# Patient Record
Sex: Female | Born: 1980 | Race: Black or African American | Hispanic: No | Marital: Single | Smoking: Never smoker
Health system: Southern US, Community
[De-identification: ages and names within clinical notes are randomized; demographics above are authoritative.]

## PROBLEM LIST (undated history)

## (undated) DIAGNOSIS — K219 Gastro-esophageal reflux disease without esophagitis: Secondary | ICD-10-CM

## (undated) DIAGNOSIS — R87629 Unspecified abnormal cytological findings in specimens from vagina: Secondary | ICD-10-CM

## (undated) DIAGNOSIS — A749 Chlamydial infection, unspecified: Secondary | ICD-10-CM

## (undated) DIAGNOSIS — B977 Papillomavirus as the cause of diseases classified elsewhere: Secondary | ICD-10-CM

## (undated) DIAGNOSIS — E049 Nontoxic goiter, unspecified: Secondary | ICD-10-CM

## (undated) DIAGNOSIS — O26899 Other specified pregnancy related conditions, unspecified trimester: Secondary | ICD-10-CM

## (undated) DIAGNOSIS — C52 Malignant neoplasm of vagina: Secondary | ICD-10-CM

## (undated) DIAGNOSIS — F419 Anxiety disorder, unspecified: Secondary | ICD-10-CM

## (undated) DIAGNOSIS — D649 Anemia, unspecified: Secondary | ICD-10-CM

## (undated) DIAGNOSIS — F32A Depression, unspecified: Secondary | ICD-10-CM

## (undated) DIAGNOSIS — G5600 Carpal tunnel syndrome, unspecified upper limb: Secondary | ICD-10-CM

## (undated) DIAGNOSIS — A599 Trichomoniasis, unspecified: Secondary | ICD-10-CM

## (undated) DIAGNOSIS — K4090 Unilateral inguinal hernia, without obstruction or gangrene, not specified as recurrent: Secondary | ICD-10-CM

## (undated) DIAGNOSIS — E559 Vitamin D deficiency, unspecified: Secondary | ICD-10-CM

## (undated) HISTORY — DX: Papillomavirus as the cause of diseases classified elsewhere: B97.7

## (undated) HISTORY — DX: Unilateral inguinal hernia, without obstruction or gangrene, not specified as recurrent: K40.90

## (undated) HISTORY — PX: NO PAST SURGERIES: SHX2092

## (undated) HISTORY — DX: Chlamydial infection, unspecified: A74.9

## (undated) HISTORY — DX: Gastro-esophageal reflux disease without esophagitis: K21.9

## (undated) HISTORY — DX: Unspecified abnormal cytological findings in specimens from vagina: R87.629

## (undated) HISTORY — DX: Vitamin D deficiency, unspecified: E55.9

## (undated) HISTORY — DX: Anxiety disorder, unspecified: F41.9

## (undated) HISTORY — DX: Carpal tunnel syndrome, unspecified upper limb: G56.00

## (undated) HISTORY — DX: Nontoxic goiter, unspecified: E04.9

## (undated) HISTORY — DX: Carpal tunnel syndrome, unspecified upper limb: O26.899

## (undated) HISTORY — DX: Trichomoniasis, unspecified: A59.9

## (undated) HISTORY — DX: Depression, unspecified: F32.A

## (undated) HISTORY — DX: Anemia, unspecified: D64.9

---
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
## 2001-05-30 ENCOUNTER — Emergency Department (HOSPITAL_COMMUNITY): Admission: EM | Admit: 2001-05-30 | Discharge: 2001-05-31 | Payer: Self-pay | Admitting: Emergency Medicine

## 2001-06-01 ENCOUNTER — Ambulatory Visit (HOSPITAL_COMMUNITY): Admission: RE | Admit: 2001-06-01 | Discharge: 2001-06-01 | Payer: Self-pay | Admitting: Emergency Medicine

## 2001-06-01 ENCOUNTER — Encounter: Payer: Self-pay | Admitting: Emergency Medicine

## 2001-06-12 ENCOUNTER — Emergency Department (HOSPITAL_COMMUNITY): Admission: EM | Admit: 2001-06-12 | Discharge: 2001-06-12 | Payer: Self-pay | Admitting: Emergency Medicine

## 2001-06-12 ENCOUNTER — Encounter: Payer: Self-pay | Admitting: Emergency Medicine

## 2001-10-11 ENCOUNTER — Inpatient Hospital Stay (HOSPITAL_COMMUNITY): Admission: AD | Admit: 2001-10-11 | Discharge: 2001-10-11 | Payer: Self-pay | Admitting: *Deleted

## 2001-12-20 ENCOUNTER — Encounter: Admission: RE | Admit: 2001-12-20 | Discharge: 2001-12-20 | Payer: Self-pay | Admitting: *Deleted

## 2002-11-15 ENCOUNTER — Other Ambulatory Visit: Admission: RE | Admit: 2002-11-15 | Discharge: 2002-11-15 | Payer: Self-pay | Admitting: Obstetrics and Gynecology

## 2002-11-21 ENCOUNTER — Inpatient Hospital Stay (HOSPITAL_COMMUNITY): Admission: AD | Admit: 2002-11-21 | Discharge: 2002-11-21 | Payer: Self-pay | Admitting: Obstetrics & Gynecology

## 2003-01-28 ENCOUNTER — Inpatient Hospital Stay (HOSPITAL_COMMUNITY): Admission: AD | Admit: 2003-01-28 | Discharge: 2003-01-28 | Payer: Self-pay | Admitting: Obstetrics & Gynecology

## 2003-01-31 ENCOUNTER — Inpatient Hospital Stay (HOSPITAL_COMMUNITY): Admission: AD | Admit: 2003-01-31 | Discharge: 2003-02-02 | Payer: Self-pay | Admitting: Obstetrics and Gynecology

## 2003-07-24 ENCOUNTER — Emergency Department (HOSPITAL_COMMUNITY): Admission: EM | Admit: 2003-07-24 | Discharge: 2003-07-24 | Payer: Self-pay | Admitting: Emergency Medicine

## 2003-09-21 ENCOUNTER — Emergency Department (HOSPITAL_COMMUNITY): Admission: EM | Admit: 2003-09-21 | Discharge: 2003-09-21 | Payer: Self-pay | Admitting: Emergency Medicine

## 2003-10-28 ENCOUNTER — Emergency Department (HOSPITAL_COMMUNITY): Admission: EM | Admit: 2003-10-28 | Discharge: 2003-10-28 | Payer: Self-pay | Admitting: Emergency Medicine

## 2004-05-08 ENCOUNTER — Encounter: Admission: RE | Admit: 2004-05-08 | Discharge: 2004-05-08 | Payer: Self-pay | Admitting: Cardiology

## 2004-09-01 ENCOUNTER — Other Ambulatory Visit: Admission: RE | Admit: 2004-09-01 | Discharge: 2004-09-01 | Payer: Self-pay | Admitting: Obstetrics and Gynecology

## 2004-11-03 ENCOUNTER — Other Ambulatory Visit: Admission: RE | Admit: 2004-11-03 | Discharge: 2004-11-03 | Payer: Self-pay | Admitting: Obstetrics and Gynecology

## 2004-11-26 ENCOUNTER — Emergency Department (HOSPITAL_COMMUNITY): Admission: EM | Admit: 2004-11-26 | Discharge: 2004-11-26 | Payer: Self-pay | Admitting: Emergency Medicine

## 2005-01-13 ENCOUNTER — Emergency Department (HOSPITAL_COMMUNITY): Admission: EM | Admit: 2005-01-13 | Discharge: 2005-01-14 | Payer: Self-pay | Admitting: Emergency Medicine

## 2005-01-16 ENCOUNTER — Emergency Department (HOSPITAL_COMMUNITY): Admission: EM | Admit: 2005-01-16 | Discharge: 2005-01-16 | Payer: Self-pay | Admitting: Family Medicine

## 2005-02-04 ENCOUNTER — Encounter: Admission: RE | Admit: 2005-02-04 | Discharge: 2005-02-04 | Payer: Self-pay | Admitting: Cardiology

## 2005-02-05 ENCOUNTER — Ambulatory Visit (HOSPITAL_COMMUNITY): Admission: RE | Admit: 2005-02-05 | Discharge: 2005-02-05 | Payer: Self-pay | Admitting: Gastroenterology

## 2005-02-20 ENCOUNTER — Other Ambulatory Visit: Admission: RE | Admit: 2005-02-20 | Discharge: 2005-02-20 | Payer: Self-pay | Admitting: Obstetrics and Gynecology

## 2005-06-16 ENCOUNTER — Encounter: Admission: RE | Admit: 2005-06-16 | Discharge: 2005-06-16 | Payer: Self-pay | Admitting: Cardiology

## 2005-10-09 ENCOUNTER — Other Ambulatory Visit: Admission: RE | Admit: 2005-10-09 | Discharge: 2005-10-09 | Payer: Self-pay | Admitting: Obstetrics and Gynecology

## 2005-11-14 ENCOUNTER — Emergency Department (HOSPITAL_COMMUNITY): Admission: AD | Admit: 2005-11-14 | Discharge: 2005-11-14 | Payer: Self-pay | Admitting: Family Medicine

## 2006-03-15 ENCOUNTER — Emergency Department (HOSPITAL_COMMUNITY): Admission: EM | Admit: 2006-03-15 | Discharge: 2006-03-15 | Payer: Self-pay | Admitting: Family Medicine

## 2006-05-03 ENCOUNTER — Emergency Department (HOSPITAL_COMMUNITY): Admission: EM | Admit: 2006-05-03 | Discharge: 2006-05-03 | Payer: Self-pay | Admitting: Emergency Medicine

## 2006-05-05 ENCOUNTER — Inpatient Hospital Stay (HOSPITAL_COMMUNITY): Admission: AD | Admit: 2006-05-05 | Discharge: 2006-05-05 | Payer: Self-pay | Admitting: Gynecology

## 2006-05-10 ENCOUNTER — Inpatient Hospital Stay (HOSPITAL_COMMUNITY): Admission: AD | Admit: 2006-05-10 | Discharge: 2006-05-10 | Payer: Self-pay | Admitting: Obstetrics & Gynecology

## 2006-07-26 ENCOUNTER — Ambulatory Visit (HOSPITAL_COMMUNITY): Admission: RE | Admit: 2006-07-26 | Discharge: 2006-07-26 | Payer: Self-pay | Admitting: Obstetrics and Gynecology

## 2006-09-20 ENCOUNTER — Inpatient Hospital Stay (HOSPITAL_COMMUNITY): Admission: AD | Admit: 2006-09-20 | Discharge: 2006-09-20 | Payer: Self-pay | Admitting: Obstetrics and Gynecology

## 2006-10-07 ENCOUNTER — Inpatient Hospital Stay (HOSPITAL_COMMUNITY): Admission: AD | Admit: 2006-10-07 | Discharge: 2006-10-07 | Payer: Self-pay | Admitting: Obstetrics and Gynecology

## 2006-12-10 ENCOUNTER — Inpatient Hospital Stay (HOSPITAL_COMMUNITY): Admission: AD | Admit: 2006-12-10 | Discharge: 2006-12-12 | Payer: Self-pay | Admitting: Obstetrics and Gynecology

## 2006-12-31 ENCOUNTER — Ambulatory Visit: Admission: RE | Admit: 2006-12-31 | Discharge: 2006-12-31 | Payer: Self-pay | Admitting: Obstetrics and Gynecology

## 2007-03-23 ENCOUNTER — Encounter: Admission: RE | Admit: 2007-03-23 | Discharge: 2007-04-22 | Payer: Self-pay | Admitting: Obstetrics and Gynecology

## 2007-04-23 ENCOUNTER — Encounter: Admission: RE | Admit: 2007-04-23 | Discharge: 2007-05-23 | Payer: Self-pay | Admitting: Obstetrics and Gynecology

## 2007-05-24 ENCOUNTER — Encounter: Admission: RE | Admit: 2007-05-24 | Discharge: 2007-06-22 | Payer: Self-pay | Admitting: Obstetrics and Gynecology

## 2007-06-23 ENCOUNTER — Encounter: Admission: RE | Admit: 2007-06-23 | Discharge: 2007-07-23 | Payer: Self-pay | Admitting: Obstetrics and Gynecology

## 2007-07-24 ENCOUNTER — Encounter: Admission: RE | Admit: 2007-07-24 | Discharge: 2007-08-22 | Payer: Self-pay | Admitting: Obstetrics and Gynecology

## 2007-08-23 ENCOUNTER — Encounter: Admission: RE | Admit: 2007-08-23 | Discharge: 2007-09-22 | Payer: Self-pay | Admitting: Obstetrics and Gynecology

## 2007-09-23 ENCOUNTER — Encounter: Admission: RE | Admit: 2007-09-23 | Discharge: 2007-10-23 | Payer: Self-pay | Admitting: Obstetrics and Gynecology

## 2007-10-24 ENCOUNTER — Encounter: Admission: RE | Admit: 2007-10-24 | Discharge: 2007-11-03 | Payer: Self-pay | Admitting: Obstetrics and Gynecology

## 2008-06-03 ENCOUNTER — Emergency Department (HOSPITAL_COMMUNITY): Admission: EM | Admit: 2008-06-03 | Discharge: 2008-06-03 | Payer: Self-pay | Admitting: Family Medicine

## 2008-08-01 ENCOUNTER — Encounter: Admission: RE | Admit: 2008-08-01 | Discharge: 2008-08-01 | Payer: Self-pay | Admitting: Cardiology

## 2008-08-07 ENCOUNTER — Ambulatory Visit (HOSPITAL_COMMUNITY): Admission: RE | Admit: 2008-08-07 | Discharge: 2008-08-07 | Payer: Self-pay | Admitting: Cardiology

## 2011-01-27 NOTE — Procedures (Signed)
EEG NUMBER:  [id number].   REFERRING PHYSICIAN:  Osvaldo Shipper. Spruill, M.D.   This is a 30 year old woman with a history of a subdural hematoma in  2005, complaining of stabbing like pains on the left side of her head,  which began in October 2009.   MEDICATIONS:  Implanon, birth control.   This was a routine 17-channel EEG with one channel devoted to EKG  utilizing [employer] 10/20 lead placement system.  The patient was  described clinically as being awake and asleep.  Electrographically, she  was awake, drowsy, and asleep.  While awake the background consists of a  well-organized, well-developed, well-modulated 10-11 Hz alpha activity,  which is predominant in the posterior head regions and reactive to eye  opening.  No interhemispheric asymmetry is identified and no definite  epileptiform discharges were seen.  There was attenuation in the  background with decrease in frequency and amplitude with drowsiness and  development of some central sharp activity, K complexes, and sleep  spindles during sleep, some mild delta activity was also seen during  sleep.  Again no interhemispheric asymmetry is identified.   Activation procedures including photic stimulation and hyperventilation  were performed, did not produce any significant change in the background  activity.  EKG monitor reveals relatively regular rhythm with a rate of  60 beats per minute.   CONCLUSION:  Normal awake and asleep EEG without seizure activity or  focal abnormality seen during the course of today's recording.  Clinical  correlation is recommended.      Catherine A. Orlin Hilding, M.D.  Electronically Signed     ZOX:[id number]  D:  08/07/2008 16:20:35  T:  08/08/2008 02:39:41  Job #:  [id number]

## 2011-01-30 NOTE — Op Note (Signed)
[name], [name]              ACCOUNT NO.:  [id number]   MEDICAL RECORD NO.:  [id number]          PATIENT TYPE:  OUT   LOCATION:  ULT                           FACILITY:  [facility]   PHYSICIAN:  Naima A. Dillard, M.D. DATE OF BIRTH:  [birth year]   DATE OF PROCEDURE:  07/26/2006  DATE OF DISCHARGE:                                 OPERATIVE REPORT   PREOPERATIVE DIAGNOSIS:  Pregnancy at 18 weeks with increased risk for  trisomy 18 on quadruple screen with normal ultrasound.   POSTOPERATIVE DIAGNOSIS:  Pregnancy at 18 weeks with increased risk for  trisomy 18 on quadruple screen with normal ultrasound.   PROCEDURE:  Amniocentesis.   SURGEON:  Naima A. Dillard, M.D.   BLOOD LOSS:  Minimal.   FINDINGS:  Infant in variable presentation with a normal-appearing  anatomical ultrasound.   PROCEDURE IN DETAIL:  Before the procedure, the patient understood that the  risks are not limited to bleeding, infection, premature rupture of  membranes, and spontaneous abortion or miscarriage of about 1 in 200.  The  patient still desired to proceed.  The abdomen was scant, and a nice little  pocket was noted.  The placenta was posterior.  The patient was consented  for the procedure.  Her blood type was found to be O+.  Patient was prepped  and draped in a normal sterile fashion.  Lidocaine 1% 1 cc was used for  anesthesia, and ultrasound view was placed into the amniotic fluid through  the abdominal wall under ultrasound guidance.  The amniotic fluid had a  blood tinge in it, and I was able to obtain some of it but not all.  The tip  of the needle was close to the posterior aspect of the placenta, but even  with pulling the needle back and trying to get fluid back, no fluid came.  It was felt that it was clotted off in the needle, so the needle was  removed.  The patient told that we could abort the procedure or she could  have another stick, but we told the risks were slightly increased with  another stick.  The patient wanted [place] to proceed with another amniotic fluid  stick, and I gave her another 2 cc of 1% lidocaine, and again, used the 22  gauge OmniView needle.  The second time, the needle penetrated the amniotic  fluid sac, and 20 cc of clear fluid was obtained.  The needle was removed.  The patient did have some cramping.  Fetal heart rates were normal before  and after.  The patient was given precautions and given Motrin for 24 hours.  The amniocentesis will be sent for [facility] and chromosomal testing.  Patient  also wanted DNA testing, but when she found out the cost, decided against  it.      [name], M.D.  Electronically Signed     NAD/MEDQ  D:  07/26/2006  T:  07/26/2006  Job:  [id number]

## 2011-01-30 NOTE — H&P (Signed)
NAMERASHELLE, IRELAND              ACCOUNT NO.:  [id number]   MEDICAL RECORD NO.:  [id number]          PATIENT TYPE:  INP   LOCATION:  9169                          [facility]ACILITY:  [facility]   PHYSICIAN:  Hal Morales, M.D.DATE OF BIRTH:  1981/05/13   DATE OF ADMISSION:  12/10/2006  DATE OF DISCHARGE:                              HISTORY & PHYSICAL   Courtney [name] is a 30 year old gravida 2, para 1-0-0-1 at 38-4/7 weeks who  presents from the office in active labor. At the office her cervix by  Dr. Les Pou exam was 4 cm, 80% vertex at a -1 station. The patient  experienced spontaneous rupture of membranes on the way to the hospital  and presented completely dilated to the birthing suite. Pregnancy has  been remarkable for:   1. Late prenatal care with care beginning at approximately 15 weeks.  2. First semester spotting.  3. Gastroesophageal reflux disease.  4. History of abnormal Pap in 2006, but Paps in 2007 have been normal.  5. Patient also had an increased risk of trisomy-18, but had a normal      amniocentesis.   PRENATAL LABORATORIES:  Blood type is 0 positive, Rh antibody negative,  VDRL nonreactive, Rubella titer positive, hepatitis B surface antigen  negative, HIV nonreactive. Cystic fibrosis testing was negative. Sickle  cell test was negative. GC, chlamydia cultures were negative in  September. Negative at full term. Group B strep culture was also  negative at 36 weeks. Pap was normal in January 2007. Hemoglobin upon  entering the practice was 13.2. It was within normal limits at 28 weeks.  Quadruple screen was done at 16 weeks, and she had an elevated trisomy-  18 risk with normal amniocentesis. Glucola was normal. RPR was  nonreactive at 30 weeks.   HISTORY OF PRESENT PREGNANCY:  The patient entered care at approximately  14 weeks. She had quadruple screen done that showed elevated trisomy-18  risk. She had amniocentesis that showed normal findings. She had an  upper  respiratory congestion at 19 weeks. She had some cramping at 42  weeks. She had normal cultures. Cervix was closed. She was placed on  ibuprofen. She had another ultrasound at 24 weeks showing normal growth  development with normal cervical links. Ultrasounds were repeated every  four weeks secondary to abnormal quadruple screen at 28 weeks. Growth  was at the 76 percentile, and fluid was at the 95th percentile. Glucola  was normal. The patient was diagnosed with lichen simplex and eczema.  She had another ultrasound at 32 weeks for normal growth and fluid. She  had another ultrasound at 36 weeks again showing normal growth. Rest of  her pregnancy was essentially uncomplicated with negative group B strep  and cultures at 36 weeks.   OBSTETRICAL HISTORY:  In 2004 she had a vaginal birth of a female infant  that weighed 5 pounds 6 oz at 39 weeks. She was in labor 13-1/2 hours.  She had epidural anesthesia. She had no complications. She did have some  mild elevation of her blood pressure during that pregnancy, but it was  resolved with  bed rest.   PAST MEDICAL HISTORY:  She had a history of chlamydia in 2005. 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 She had a  colposcopy showing CIN-1. Her followup Paps have been normal. The  patient does have a history of some superficial varicosities. She does  have a history of gastroesophageal reflux disease and has had a  colonoscopy for rectal bleeding. There was nothing noted except  hemorrhoids.   FAMILY HISTORY:  Her father has hypertension. Her mother and maternal  grandmother have varicose veins. Her mother has arthritis. Her paternal  grandfather and maternal grandfather had prostate cancer, and maternal  grandmother had ovarian cancer. Her mother and father are smokers.   GENETIC HISTORY:  Unremarkable.   SOCIAL HISTORY:  Patient is single. Father of baby is involved and  supportive. His name is Lorenza Chick. The patient is a Geologist, engineering. Her partner also works at [employer].  She is being followed overall by the certified nurse midwife service at  [facility], although she has seen the physicians as well.  She denies any alcohol, drug or tobacco use during this pregnancy.   PHYSICAL EXAMINATION:  VITAL SIGNS:  Stable, afebrile.  HEENT:  Within normal limits.  LUNGS:  Breath sounds are clear.  HEART:  Regular rate and rhythm without murmur.  BREASTS:  Soft and nontender.  ABDOMEN:  Fundal height is approximately 37 cm. Estimated fetal weight  is 6 to 6-1/2 pounds. Uterine contractions are every 2-3 minutes, strong  quality. Cervical exam is completely dilated. Vertex at +2 station.  Membranes have been ruptured. There is a small amount of bloody show  noted, and clear fluid is noted. Fetal heart rate is in the 130s by  auscultation.  EXTREMITIES:  Deep tendon reflexes are 2+ without clonus. There is trace  edema noted.   IMPRESSION:  1. Intrauterine pregnancy at 38-4/7 weeks.  2. Second stage labor.  3. Elevated trisomy-18 risk with normal amniocentesis.  4. Negative group B strep.   PLAN:  1. Admit to birthing suite for consult with Dr. Dierdre Forth who is      attending physician.  2. Routine certified nurse midwife orders.  3. Will plan for delivery ASAP.      Renaldo Reel Emilee Hero, C.N.M.      Hal Morales, M.D.  Electronically Signed    VLL/MEDQ  D:  12/10/2006  T:  12/10/2006  Job:  [id number]

## 2011-09-01 ENCOUNTER — Ambulatory Visit (INDEPENDENT_AMBULATORY_CARE_PROVIDER_SITE_OTHER): Payer: 59

## 2011-09-01 DIAGNOSIS — J111 Influenza due to unidentified influenza virus with other respiratory manifestations: Secondary | ICD-10-CM

## 2011-12-02 ENCOUNTER — Emergency Department (HOSPITAL_COMMUNITY)
Admission: EM | Admit: 2011-12-02 | Discharge: 2011-12-02 | Disposition: A | Discharge status: Home / Self Care | Payer: 59 | Attending: Emergency Medicine | Admitting: Emergency Medicine

## 2011-12-02 ENCOUNTER — Encounter (HOSPITAL_COMMUNITY): Payer: Self-pay | Admitting: Emergency Medicine

## 2011-12-02 DIAGNOSIS — R109 Unspecified abdominal pain: Secondary | ICD-10-CM | POA: Insufficient documentation

## 2011-12-02 DIAGNOSIS — K529 Noninfective gastroenteritis and colitis, unspecified: Secondary | ICD-10-CM

## 2011-12-02 DIAGNOSIS — R197 Diarrhea, unspecified: Secondary | ICD-10-CM | POA: Insufficient documentation

## 2011-12-02 DIAGNOSIS — K5289 Other specified noninfective gastroenteritis and colitis: Secondary | ICD-10-CM | POA: Insufficient documentation

## 2011-12-02 DIAGNOSIS — R112 Nausea with vomiting, unspecified: Secondary | ICD-10-CM | POA: Insufficient documentation

## 2011-12-02 LAB — URINALYSIS, ROUTINE W REFLEX MICROSCOPIC
Glucose, UA: NEGATIVE mg/dL
Leukocytes, UA: NEGATIVE
Specific Gravity, Urine: 1.027 (ref 1.005–1.030)
pH: 6 (ref 5.0–8.0)

## 2011-12-02 LAB — POCT I-STAT, CHEM 8
Calcium, Ion: 1.15 mmol/L (ref 1.12–1.32)
Chloride: 106 mEq/L (ref 96–112)
HCT: 44 % (ref 36.0–46.0)
Potassium: 3.3 mEq/L — ABNORMAL LOW (ref 3.5–5.1)
Sodium: 141 mEq/L (ref 135–145)

## 2011-12-02 LAB — PREGNANCY, URINE: Preg Test, Ur: NEGATIVE

## 2011-12-02 MED ORDER — DICYCLOMINE HCL 20 MG PO TABS: 20.0000 mg | ORAL_TABLET | Freq: Two times a day (BID) | ORAL | Status: AC

## 2011-12-02 MED ORDER — METOCLOPRAMIDE HCL 5 MG/ML IJ SOLN
10.0000 mg | Freq: Once | INTRAMUSCULAR | Status: AC
  Administered 2011-12-02: 10 mg via INTRAVENOUS
  Filled 2011-12-02: qty 2

## 2011-12-02 MED ORDER — SODIUM CHLORIDE 0.9 % IV BOLUS (SEPSIS)
1000.0000 mL | Freq: Once | INTRAVENOUS | Status: AC
  Administered 2011-12-02: 1000 mL via INTRAVENOUS

## 2011-12-02 MED ORDER — DICYCLOMINE HCL 20 MG PO TABS
20.0000 mg | ORAL_TABLET | ORAL | Status: AC
Start: 2011-12-02 — End: 2011-12-02
  Administered 2011-12-02: 20 mg via ORAL
  Filled 2011-12-02: qty 1

## 2011-12-02 MED ORDER — ONDANSETRON HCL 4 MG PO TABS: 4.0000 mg | ORAL_TABLET | Freq: Four times a day (QID) | ORAL | Status: AC

## 2011-12-02 MED ORDER — ONDANSETRON HCL 4 MG/2ML IJ SOLN
4.0000 mg | Freq: Once | INTRAMUSCULAR | Status: AC
  Administered 2011-12-02: 4 mg via INTRAVENOUS
  Filled 2011-12-02: qty 2

## 2011-12-02 NOTE — ED Notes (Signed)
Patient states she started with abd. Pain and n/v/d yest around 200pm. C/o abd. Cramping and diarrhea.

## 2011-12-02 NOTE — ED Notes (Signed)
Bolus with ns

## 2011-12-02 NOTE — ED Notes (Signed)
Patient with nausea/vomiting/diarrhea since yesterday, feeling sick since Monday.  Patient states she has been having diarrhea once every hour, unable to keep fluids down.

## 2011-12-02 NOTE — ED Notes (Signed)
Pt co abdominal pain, emesis and diarrhea that began yesterday.  Pain 8/10 and is constant cramping.

## 2011-12-02 NOTE — Discharge Instructions (Signed)
B.R.A.T. Diet for the next 8-12 hours then advance as tolerated. Zofran for nausea, push noncaffeinated drinks in small amounts. Return here with any high fever, severe pain, bloody vomiting or diarrhea or new concern. Recommend Immodium if having greater than 5 bowel movements daily. Bentyl for abdominal cramping as needed.    B.R.A.T. Diet Your doctor has recommended the B.R.A.T. diet for you or your child until the condition improves. This is often used to help control diarrhea and vomiting symptoms. If you or your child can tolerate clear liquids, you may have:  Bananas.   Rice.   Applesauce.   Toast (and other simple starches such as crackers, potatoes, noodles).  Be sure to avoid dairy products, meats, and fatty foods until symptoms are better. Fruit juices such as apple, grape, and prune juice can make diarrhea worse. Avoid these. Continue this diet for 2 days or as instructed by your caregiver. Document Released: 08/31/2005 Document Revised: 08/20/2011 Document Reviewed: 02/17/2007 [facility] Patient Information 2012 [place], [place].   Diet for Diarrhea, Adult Having frequent, runny stools (diarrhea) has many causes. Diarrhea may be caused or worsened by food or drink. Diarrhea may be relieved by changing your diet. IF YOU ARE NOT TOLERATING SOLID FOODS:  Drink enough water and fluids to keep your urine clear or pale yellow.   Avoid sugary drinks and sodas as well as milk-based beverages.   Avoid beverages containing caffeine and alcohol.   You may try rehydrating beverages. You can make your own by following this recipe:    tsp table salt.    tsp baking soda.   ? tsp salt substitute (potassium chloride).   1 tbs + 1 tsp sugar.   1 qt water.  As your stools become more solid, you can start eating solid foods. Add foods one at a time. If a certain food causes your diarrhea to get worse, avoid that food and try other foods. A low fiber, low-fat, and lactose-free diet is  recommended. Small, frequent meals may be better tolerated.  Starches  Allowed:  White, [place], and pita breads, plain rolls, buns, bagels. Plain muffins, matzo. Soda, saltine, or graham crackers. Pretzels, melba toast, zwieback. Cooked cereals made with water: cornmeal, farina, cream cereals. Dry cereals: refined corn, wheat, rice. Potatoes prepared any way without skins, refined macaroni, spaghetti, noodles, refined rice.   Avoid:  Bread, rolls, or crackers made with whole wheat, multi-grains, rye, bran seeds, nuts, or coconut. Corn tortillas or taco shells. Cereals containing whole grains, multi-grains, bran, coconut, nuts, or raisins. Cooked or dry oatmeal. Coarse wheat cereals, granola. Cereals advertised as "high-fiber." Potato skins. Whole grain pasta, wild or brown rice. Popcorn. Sweet potatoes/yams. Sweet rolls, doughnuts, waffles, pancakes, sweet breads.  Vegetables  Allowed: Strained tomato and vegetable juices. Most well-cooked and canned vegetables without seeds. Fresh: Tender lettuce, cucumber without the skin, cabbage, spinach, bean sprouts.   Avoid: Fresh, cooked, or canned: Artichokes, baked beans, beet greens, broccoli, Brussels sprouts, corn, kale, legumes, peas, sweet potatoes. Cooked: Green or red cabbage, spinach. Avoid large servings of any vegetables, because vegetables shrink when cooked, and they contain more fiber per serving than fresh vegetables.  Fruit  Allowed: All fruit juices except prune juice. Cooked or canned: Apricots, applesauce, cantaloupe, cherries, fruit cocktail, grapefruit, grapes, kiwi, mandarin oranges, peaches, pears, plums, watermelon. Fresh: Apples without skin, ripe banana, grapes, cantaloupe, cherries, grapefruit, peaches, oranges, plums. Keep servings limited to  cup or 1 piece.   Avoid: Fresh: Apple with skin, apricots, mango, pears,  raspberries, strawberries. Prune juice, stewed or dried prunes. Dried fruits, raisins, dates. Large servings of all  fresh fruits.  Meat and Meat Substitutes  Allowed: Ground or well-cooked tender beef, ham, veal, lamb, pork, or poultry. Eggs, plain cheese. Fish, oysters, shrimp, lobster, other seafoods. Liver, organ meats.   Avoid: Tough, fibrous meats with gristle. Peanut butter, smooth or chunky. Cheese, nuts, seeds, legumes, dried peas, beans, lentils.  Milk  Allowed: Yogurt, lactose-free milk, kefir, drinkable yogurt, buttermilk, soy milk.   Avoid: Milk, chocolate milk, beverages made with milk, such as milk shakes.  Soups  Allowed: Bouillon, broth, or soups made from allowed foods. Any strained soup.   Avoid: Soups made from vegetables that are not allowed, cream or milk-based soups.  Desserts and Sweets  Allowed: Sugar-free gelatin, sugar-free frozen ice pops made without sugar alcohol.   Avoid: Plain cakes and cookies, pie made with allowed fruit, pudding, custard, cream pie. Gelatin, fruit, ice, sherbet, frozen ice pops. Ice cream, ice milk without nuts. Plain hard candy, honey, jelly, molasses, syrup, sugar, chocolate syrup, gumdrops, marshmallows.  Fats and Oils  Allowed: Avoid any fats and oils.   Avoid: Seeds, nuts, olives, avocados. Margarine, butter, cream, mayonnaise, salad oils, plain salad dressings made from allowed foods. Plain gravy, crisp bacon without rind.  Beverages  Allowed: Water, decaffeinated teas, oral rehydration solutions, sugar-free beverages.   Avoid: Fruit juices, caffeinated beverages (coffee, tea, soda or pop), alcohol, sports drinks, or lemon-lime soda or pop.  Condiments  Allowed: Ketchup, mustard, horseradish, vinegar, cream sauce, cheese sauce, cocoa powder. Spices in moderation: allspice, basil, bay leaves, celery powder or leaves, cinnamon, cumin powder, curry powder, ginger, mace, marjoram, onion or garlic powder, oregano, paprika, parsley flakes, ground pepper, rosemary, sage, savory, tarragon, thyme, turmeric.   Avoid: Coconut, honey.  Weight  Monitoring: Weigh yourself every day. You should weigh yourself in the morning after you urinate and before you eat breakfast. Wear the same amount of clothing when you weigh yourself. Record your weight daily. Bring your recorded weights to your clinic visits. Tell your caregiver right away if you have gained 3 lb/1.4 kg or more in 1 day, 5 lb/2.3 kg in a week, or whatever amount you were told to report. SEEK IMMEDIATE MEDICAL CARE IF:   You are unable to keep fluids down.   You start to throw up (vomit) or diarrhea keeps coming back (persistent).   Abdominal pain develops, increases, or can be felt in one place (localizes).   You have an oral temperature above 102 F (38.9 C), not controlled by medicine.   Diarrhea contains blood or mucus.   You develop excessive weakness, dizziness, fainting, or extreme thirst.  MAKE SURE YOU:   Understand these instructions.   Will watch your condition.   Will get help right away if you are not doing well or get worse.  Document Released: 11/21/2003 Document Revised: 08/20/2011 Document Reviewed: 03/14/2009 [facility] Patient Information 2012 [place], [place].  Colitis Colitis is inflammation of the colon. Colitis can be a short-term or long-standing (chronic) illness. Crohn's disease and ulcerative colitis are 2 types of colitis which are chronic. They usually require lifelong treatment. CAUSES  There are many different causes of colitis, including:  Viruses.   Germs (bacteria).   Medicine reactions.  SYMPTOMS   Diarrhea.   Intestinal bleeding.   Pain.   Fever.   Throwing up (vomiting).   Tiredness (fatigue).   Weight loss.   Bowel blockage.  DIAGNOSIS  The diagnosis of colitis  is based on examination and stool or blood tests. X-rays, CT scan, and colonoscopy may also be needed. TREATMENT  Treatment may include:  Fluids given through the vein (intravenously).   Bowel rest (nothing to eat or drink for a period of time).    Medicine for pain and diarrhea.   Medicines (antibiotics) that kill germs.   Cortisone medicines.   Surgery.  HOME CARE INSTRUCTIONS   Get plenty of rest.   Drink enough water and fluids to keep your urine clear or pale yellow.   Eat a well-balanced diet.   Call your caregiver for follow-up as recommended.  SEEK IMMEDIATE MEDICAL CARE IF:   You develop chills.   You have an oral temperature above 102 F (38.9 C), not controlled by medicine.   You have extreme weakness, fainting, or dehydration.   You have repeated vomiting.   You develop severe belly (abdominal) pain or are passing bloody or tarry stools.  MAKE SURE YOU:   Understand these instructions.   Will watch your condition.   Will get help right away if you are not doing well or get worse.  Document Released: 10/08/2004 Document Revised: 08/20/2011 Document Reviewed: 01/03/2010 [facility] Patient Information 2012 [place], [place].

## 2011-12-02 NOTE — ED Provider Notes (Signed)
History     CSN: [id number]  Arrival date & time 12/02/11  0559   First MD Initiated Contact with Patient 12/02/11 [phone]      Chief Complaint  Patient presents with  . Nausea  . Emesis  . Diarrhea    (Consider location/radiation/quality/duration/timing/severity/associated sxs/prior treatment) Patient is a 31 y.o. female presenting with vomiting and diarrhea. The history is provided by the patient.  Emesis  This is a new problem. The current episode started 6 to 12 hours ago. The problem occurs 2 to 4 times per day. The problem has not changed since onset.There has been no fever. Associated symptoms include abdominal pain and diarrhea. Pertinent negatives include no chills and no fever. Associated symptoms comments: Symptoms started yesterday with mild nausea that progressed to about 4 episodes of vomiting and copious diarrhea through the night. No dysuria. No history of abdominal surgeries. . Risk factors include ill contacts.  Diarrhea The primary symptoms include abdominal pain, nausea, vomiting and diarrhea. Primary symptoms do not include fever, dysuria or rash.  The diarrhea began yesterday. The diarrhea is watery. The diarrhea occurs 5 to 10 times per day.  The illness does not include chills.    History reviewed. No pertinent past medical history.  History reviewed. No pertinent past surgical history.  History reviewed. No pertinent family history.  History  Substance Use Topics  . Smoking status: Never Smoker   . Smokeless tobacco: Not on file  . Alcohol Use: No    OB History    Grav Para Term Preterm Abortions TAB SAB Ect Mult Living                  Review of Systems  Constitutional: Negative for fever and chills.  HENT: Negative.   Respiratory: Negative.   Cardiovascular: Negative.   Gastrointestinal: Positive for nausea, vomiting, abdominal pain and diarrhea. Negative for blood in stool.  Genitourinary: Negative for dysuria.  Musculoskeletal: Negative.     Skin: Negative.  Negative for rash.  Neurological: Negative.  Negative for light-headedness.    Allergies  Bee venom  Home Medications   Current Outpatient Rx  Name Route Sig Dispense Refill  . BISMUTH SUBSALICYLATE 262 MG/15ML PO SUSP Oral Take 15 mLs by mouth every 6 (six) hours as needed. For diarrhea    . LOPERAMIDE HCL 2 MG PO TABS Oral Take 2 mg by mouth 4 (four) times daily as needed. For diarrhea      BP 112/69  Pulse 84  Temp(Src) 98.1 F (36.7 C) (Oral)  Resp 14  SpO2 99%  Physical Exam  Constitutional: She appears well-developed and well-nourished.  HENT:  Head: Normocephalic.  Neck: Normal range of motion. Neck supple.  Cardiovascular: Normal rate and regular rhythm.   No murmur heard. Pulmonary/Chest: Effort normal and breath sounds normal.  Abdominal: Soft. Bowel sounds are normal. There is no tenderness. There is no rebound and no guarding.  Musculoskeletal: Normal range of motion.  Neurological: She is alert. No cranial nerve deficit.  Skin: Skin is warm and dry. No rash noted.  Psychiatric: She has a normal mood and affect.    ED Course  Procedures (including critical care time)   Labs Reviewed  URINALYSIS, ROUTINE W REFLEX MICROSCOPIC  PREGNANCY, URINE   No results found.   No diagnosis found.    MDM  Normal labs, VSS, now tolerating PO fluids. Well hydrated via 2 liters IV fluids and feels better.         Melvenia Beam  Sueanne Margarita, PA-C 12/02/11 1133

## 2011-12-02 NOTE — ED Notes (Signed)
Pt attempting fluid challenge.  Drinking ginger ale

## 2011-12-03 NOTE — ED Provider Notes (Signed)
Medical screening examination/treatment/procedure(s) were performed by non-physician practitioner and as supervising physician I was immediately available for consultation/collaboration.   Geoffery Lyons, MD 12/03/11 1544

## 2012-07-21 ENCOUNTER — Ambulatory Visit (INDEPENDENT_AMBULATORY_CARE_PROVIDER_SITE_OTHER): Payer: 59 | Admitting: Family Medicine

## 2012-07-21 VITALS — BP 120/80 | HR 68 | Temp 98.6°F | Resp 16 | Ht 69.0 in | Wt 146.2 lb

## 2012-07-21 DIAGNOSIS — N898 Other specified noninflammatory disorders of vagina: Secondary | ICD-10-CM

## 2012-07-21 DIAGNOSIS — N949 Unspecified condition associated with female genital organs and menstrual cycle: Secondary | ICD-10-CM

## 2012-07-21 DIAGNOSIS — R102 Pelvic and perineal pain: Secondary | ICD-10-CM

## 2012-07-21 DIAGNOSIS — Z202 Contact with and (suspected) exposure to infections with a predominantly sexual mode of transmission: Secondary | ICD-10-CM

## 2012-07-21 DIAGNOSIS — Z9189 Other specified personal risk factors, not elsewhere classified: Secondary | ICD-10-CM

## 2012-07-21 LAB — POCT UA - MICROSCOPIC ONLY
Mucus, UA: NEGATIVE
RBC, urine, microscopic: NEGATIVE
Yeast, UA: NEGATIVE

## 2012-07-21 LAB — POCT URINALYSIS DIPSTICK
Ketones, UA: NEGATIVE
Protein, UA: NEGATIVE
Spec Grav, UA: 1.015

## 2012-07-21 LAB — HEPATITIS C ANTIBODY: HCV Ab: NEGATIVE

## 2012-07-21 LAB — HEPATITIS B SURFACE ANTIGEN: Hepatitis B Surface Ag: NEGATIVE

## 2012-07-21 LAB — POCT WET PREP WITH KOH
KOH Prep POC: POSITIVE
Trichomonas, UA: NEGATIVE
Yeast Wet Prep HPF POC: POSITIVE

## 2012-07-21 LAB — HEPATITIS B SURFACE ANTIBODY, QUANTITATIVE: Hepatitis B-Post: 1000 m[IU]/mL

## 2012-07-21 LAB — HIV ANTIBODY (ROUTINE TESTING W REFLEX): HIV: NONREACTIVE

## 2012-07-21 MED ORDER — CEFTRIAXONE SODIUM 1 G IJ SOLR
250.0000 mg | INTRAMUSCULAR | Status: DC
  Administered 2012-07-21: 250 mg via INTRAMUSCULAR

## 2012-07-21 MED ORDER — FLUCONAZOLE 150 MG PO TABS: 150.0000 mg | ORAL_TABLET | Freq: Once | ORAL | Status: DC

## 2012-07-21 MED ORDER — AZITHROMYCIN 250 MG PO TABS: ORAL_TABLET | ORAL | Status: DC

## 2012-07-21 MED ORDER — METRONIDAZOLE 500 MG PO TABS: 500.0000 mg | ORAL_TABLET | Freq: Two times a day (BID) | ORAL | Status: DC

## 2012-07-21 NOTE — Patient Instructions (Signed)
We will be in touch with your lab results as soon as they are available.  Hold onto the flagyl and use it in a week or two.

## 2012-07-21 NOTE — Progress Notes (Signed)
[facility] [address] [place] [address] [phone]  Date:  07/21/2012   Name:  Courtney [name]   DOB:  [birth year]   MRN:  [id number]  PCP:  No primary provider on file.    Chief Complaint: Exposure to STD and Vaginal Discharge   History of Present Illness:  Courtney [name] is a 31 y.o. very pleasant female patient who presents with the following:  She is here today with concern regarding an STD.  Her long- term BF (and the father of her child) has some penile discharge- she first noted this over the last few days.   Last night he admitted to having sexual encounters with other women.  She is not sure how long he has been in contact with other partners.   She has noted some pelvic cramping for the last 2 or 3 days.  She also notes some vaginal discharge and itching and wonders if she might have a yeast infection.   She is on Depo- provera.  She does not get menses.  Her shot is due next week- she has not missed a shot.   She is not having nausea, vomiting or fever.  She does not note UTI symptoms.    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  She is otherwise healthy.    There is no problem list on file for this patient.   Past Medical History  Diagnosis Date  . Anemia     History reviewed. No pertinent past surgical history.  History  Substance Use Topics  . Smoking status: Never Smoker   . Smokeless tobacco: Not on file  . Alcohol Use: No    Family History  Problem Relation Age of Onset  . Hypertension Mother   . Hyperlipidemia Mother   . Hypertension Father   . Heart murmur Sister   . Cancer Maternal Grandmother   . Cancer Maternal Grandfather   . Stroke Paternal Grandmother   . Cancer Paternal Grandfather     Allergies  Allergen Reactions  . Bee Venom     Medication list has been reviewed and updated.  Current Outpatient Prescriptions on File Prior to Visit  Medication Sig Dispense Refill  . medroxyPROGESTERone (DEPO-PROVERA) 150 MG/ML  injection Inject 150 mg into the muscle every 3 (three) months.      . bismuth subsalicylate (PEPTO BISMOL) 262 MG/15ML suspension Take 15 mLs by mouth every 6 (six) hours as needed. For diarrhea      . dicyclomine (BENTYL) 20 MG tablet Take 1 tablet (20 mg total) by mouth 2 (two) times daily.  12 tablet  0  . loperamide (IMODIUM A-D) 2 MG tablet Take 2 mg by mouth 4 (four) times daily as needed. For diarrhea        Review of Systems:  As per HPI- otherwise negative.   Physical Examination: Filed Vitals:   07/21/12 1339  BP: 120/80  Pulse: 68  Temp: 98.6 F (37 C)  Resp: 16   Filed Vitals:   07/21/12 1339  Height: 5\' 9"  (1.753 m)  Weight: 146 lb 3.2 oz (66.316 kg)   Body mass index is 21.59 kg/(m^2). Ideal Body Weight: Weight in (lb) to have BMI = 25: 168.9   GEN: WDWN, NAD, Non-toxic, A & O x 3 HEENT: Atraumatic, Normocephalic. Neck supple. No masses, No LAD.  Bilateral TM wnl, oropharynx normal.  PEERL,EOMI.   Ears and Nose: No external deformity. CV: RRR, No M/G/R. No JVD. No thrill.  No extra heart sounds. PULM: CTA B, no wheezes, crackles, rhonchi. No retractions. No resp. distress. No accessory muscle use. ABD: S, NT, +BS. No rebound. No HSM.  She has slight tenderness over her uterus/ bladder but NOT RLQ tenderness EXTR: No c/c/e NEURO Normal gait.  PSYCH: Normally interactive. Conversant. Not depressed or anxious appearing.  Calm demeanor.  GU: normal internal and external exam.  She has slight tenderness over her uterus/ bladder, but no CMT.  No adnexal tenderness or masses.    Results for orders placed in visit on 07/21/12  POCT WET PREP WITH KOH      Component Value Range   Trichomonas, UA Negative     Clue Cells Wet Prep HPF POC 100%     Epithelial Wet Prep HPF POC 3-15     Yeast Wet Prep HPF POC positive     Bacteria Wet Prep HPF POC 3+     RBC Wet Prep HPF POC 0-3     WBC Wet Prep HPF POC 10-15     KOH Prep POC Positive    POCT URINALYSIS DIPSTICK       Component Value Range   Color, UA yellow     Clarity, UA clear     Glucose, UA neg     Bilirubin, UA neg     Ketones, UA neg     Spec Grav, UA 1.015     Blood, UA neg     pH, UA 7.0     Protein, UA neg     Urobilinogen, UA 0.2     Nitrite, UA neg     Leukocytes, UA large (3+)    POCT UA - MICROSCOPIC ONLY      Component Value Range   WBC, Ur, HPF, POC 5-10     RBC, urine, microscopic neg     Bacteria, U Microscopic 2+     Mucus, UA neg     Epithelial cells, urine per micros 2-6     Crystals, Ur, HPF, POC neg     Casts, Ur, LPF, POC neg     Yeast, UA neg      Assessment and Plan: 1. Possible exposure to STD  HIV antibody, Hepatitis B surface antibody, Hepatitis B surface antigen, Hepatitis C antibody, RPR, GC/chlamydia probe amp, genital, HSV(herpes simplex vrs) 1+2 ab-IgG, cefTRIAXone (ROCEPHIN) injection 250 mg, azithromycin (ZITHROMAX) 250 MG tablet  2. Vaginal discharge  POCT Wet Prep with KOH, fluconazole (DIFLUCAN) 150 MG tablet, metroNIDAZOLE (FLAGYL) 500 MG tablet  3. Pelvic pain in female  POCT urinalysis dipstick, POCT UA - Microscopic Only, Urine culture   Courtney [name] is upset at her long- term partner's infidelity, but has a plan to move on with her life.  She wants to be treated for gonorrhea and chlamydia today- this is a reasonable step.  Given rocephin IM and 1 gm of azithromycin PO to pick up.  Her urine appears dirty but this could be due to gonorrhea or chlamydia infection.  Ordered a urine culture and will await this result prior to treatment.   Diflucan for her yeast vaginitis. She also has BV- gave her an rx for flagyl to use in a week or two so we can avoid using too many abx at once.    Will plan further follow- up pending labs.  Meds ordered this encounter  Medications  . medroxyPROGESTERone (DEPO-PROVERA) 150 MG/ML injection    Sig: Inject 150 mg into the muscle every 3 (three) months.  [name]  cefTRIAXone (ROCEPHIN) injection 250 mg    Sig:   . azithromycin  (ZITHROMAX) 250 MG tablet    Sig: Take 4 pills once    Dispense:  4 tablet    Refill:  0  . fluconazole (DIFLUCAN) 150 MG tablet    Sig: Take 1 tablet (150 mg total) by mouth once. Repeat weekly as needed    Dispense:  4 tablet    Refill:  0  . metroNIDAZOLE (FLAGYL) 500 MG tablet    Sig: Take 1 tablet (500 mg total) by mouth 2 (two) times daily. Take 1 pill twice daily for one week. NO alcohol    Dispense:  14 tablet    Refill:  0     Aleathia Purdy, MD

## 2012-07-23 ENCOUNTER — Encounter: Payer: Self-pay | Admitting: Family Medicine

## 2013-10-16 ENCOUNTER — Other Ambulatory Visit: Payer: Self-pay | Admitting: Obstetrics & Gynecology

## 2013-10-16 DIAGNOSIS — N63 Unspecified lump in unspecified breast: Secondary | ICD-10-CM

## 2013-10-16 DIAGNOSIS — N6452 Nipple discharge: Secondary | ICD-10-CM

## 2013-10-23 ENCOUNTER — Ambulatory Visit
Admission: RE | Admit: 2013-10-23 | Discharge: 2013-10-23 | Disposition: A | Discharge status: Home / Self Care | Payer: Self-pay | Source: Ambulatory Visit | Attending: Obstetrics & Gynecology | Admitting: Obstetrics & Gynecology

## 2013-10-23 ENCOUNTER — Ambulatory Visit
Admission: RE | Admit: 2013-10-23 | Discharge: 2013-10-23 | Disposition: A | Discharge status: Home / Self Care | Payer: 59 | Source: Ambulatory Visit | Attending: Obstetrics & Gynecology | Admitting: Obstetrics & Gynecology

## 2013-10-23 DIAGNOSIS — N6452 Nipple discharge: Secondary | ICD-10-CM

## 2013-10-23 DIAGNOSIS — N63 Unspecified lump in unspecified breast: Secondary | ICD-10-CM

## 2013-10-25 ENCOUNTER — Ambulatory Visit (INDEPENDENT_AMBULATORY_CARE_PROVIDER_SITE_OTHER): Payer: 59 | Admitting: General Surgery

## 2014-04-17 ENCOUNTER — Encounter: Payer: Self-pay | Admitting: *Deleted

## 2014-10-18 ENCOUNTER — Emergency Department (HOSPITAL_COMMUNITY)
Admission: EM | Admit: 2014-10-18 | Discharge: 2014-10-18 | Disposition: A | Discharge status: Home / Self Care | Payer: 59 | Attending: Emergency Medicine | Admitting: Emergency Medicine

## 2014-10-18 ENCOUNTER — Emergency Department (HOSPITAL_COMMUNITY): Payer: 59

## 2014-10-18 ENCOUNTER — Encounter (HOSPITAL_COMMUNITY): Payer: Self-pay | Admitting: Emergency Medicine

## 2014-10-18 DIAGNOSIS — Z862 Personal history of diseases of the blood and blood-forming organs and certain disorders involving the immune mechanism: Secondary | ICD-10-CM | POA: Diagnosis not present

## 2014-10-18 DIAGNOSIS — R079 Chest pain, unspecified: Secondary | ICD-10-CM | POA: Diagnosis present

## 2014-10-18 DIAGNOSIS — R05 Cough: Secondary | ICD-10-CM | POA: Diagnosis not present

## 2014-10-18 DIAGNOSIS — Z791 Long term (current) use of non-steroidal anti-inflammatories (NSAID): Secondary | ICD-10-CM | POA: Diagnosis not present

## 2014-10-18 DIAGNOSIS — R0789 Other chest pain: Secondary | ICD-10-CM

## 2014-10-18 MED ORDER — METHOCARBAMOL 500 MG PO TABS: 1000.0000 mg | ORAL_TABLET | Freq: Three times a day (TID) | ORAL | Status: DC | PRN

## 2014-10-18 MED ORDER — IBUPROFEN 600 MG PO TABS: 600.0000 mg | ORAL_TABLET | Freq: Four times a day (QID) | ORAL | Status: DC | PRN

## 2014-10-18 MED ORDER — KETOROLAC TROMETHAMINE 60 MG/2ML IM SOLN
60.0000 mg | Freq: Once | INTRAMUSCULAR | Status: AC
  Administered 2014-10-18: 60 mg via INTRAMUSCULAR
  Filled 2014-10-18: qty 2

## 2014-10-18 MED ORDER — HYDROCODONE-ACETAMINOPHEN 5-325 MG PO TABS: 1.0000 | ORAL_TABLET | ORAL | Status: DC | PRN

## 2014-10-18 MED ORDER — HYDROCODONE-ACETAMINOPHEN 5-325 MG PO TABS
1.0000 | ORAL_TABLET | Freq: Once | ORAL | Status: AC
  Administered 2014-10-18: 1 via ORAL
  Filled 2014-10-18: qty 1

## 2014-10-18 NOTE — Discharge Instructions (Signed)

## 2014-10-18 NOTE — ED Provider Notes (Signed)
CSN: [id number]     Arrival date & time 10/18/14  0211 History  This chart was scribed for Julianne Rice, MD by Delphia Grates, ED Scribe. This patient was seen in room D32C/D32C and the patient's care was started at 3:03 AM.   Chief Complaint  Patient presents with  . Cough  . Chest Pain    The history is provided by the patient. No language interpreter was used.     HPI Comments: Courtney [name] is a 34 y.o. female who presents to the Emergency Department complaining of constant, moderate right sided chest pain that radiates to the back and up to the right neck for the past 2 days. Patient reports her pain started after a dry coughing spell. She reports the pain is worse with deep breathing and with certain movements. Patient has tried Robaxin, topical cream, Tylenol without significant improvement. She denies fever, chills, leg swelling, or abdominal pain. She denies personal or family history of PE/DVT, long travel or recent surgery. Patient currently has Nuvaring in place.   Past Medical History  Diagnosis Date  . Anemia    History reviewed. No pertinent past surgical history. Family History  Problem Relation Age of Onset  . Hypertension Mother   . Hyperlipidemia Mother   . Hypertension Father   . Heart murmur Sister   . Cancer Maternal Grandmother   . Cancer Maternal Grandfather   . Stroke Paternal Grandmother   . Cancer Paternal Grandfather    History  Substance Use Topics  . Smoking status: Never Smoker   . Smokeless tobacco: Not on file  . Alcohol Use: No   OB History    No data available     Review of Systems  Constitutional: Negative for fever and chills.  Respiratory: Positive for cough. Negative for shortness of breath and wheezing.   Cardiovascular: Positive for chest pain. Negative for palpitations and leg swelling.  Gastrointestinal: Negative for nausea and abdominal pain.  Musculoskeletal: Positive for myalgias, back pain and neck pain. Negative for  neck stiffness.  Skin: Negative for pallor and rash.  Neurological: Negative for dizziness, weakness, light-headedness, numbness and headaches.  All other systems reviewed and are negative.     Allergies  Bee venom  Home Medications   Prior to Admission medications   Medication Sig Start Date End Date Taking? Authorizing Provider  acetaminophen (TYLENOL) 325 MG tablet Take 650 mg by mouth every 6 (six) hours as needed for mild pain.   Yes Historical Provider, MD  diclofenac (VOLTAREN) 75 MG EC tablet Take 75 mg by mouth 2 (two) times daily.  07/15/14  Yes Historical Provider, MD  NUVARING 0.12-0.015 MG/24HR vaginal ring Place 1 each vaginally every 28 (twenty-eight) days.  09/25/14  Yes Historical Provider, MD  azithromycin (ZITHROMAX) 250 MG tablet Take 4 pills once Patient not taking: Reported on 10/18/2014 07/21/12   Gay Filler Copland, MD  bismuth subsalicylate (PEPTO BISMOL) 262 MG/15ML suspension Take 15 mLs by mouth every 6 (six) hours as needed. For diarrhea    Historical Provider, MD  fluconazole (DIFLUCAN) 150 MG tablet Take 1 tablet (150 mg total) by mouth once. Repeat weekly as needed Patient not taking: Reported on 10/18/2014 07/21/12   Darreld Mclean, MD  HYDROcodone-acetaminophen (NORCO) 5-325 MG per tablet Take 1 tablet by mouth every 4 (four) hours as needed. 10/18/14   Julianne Rice, MD  ibuprofen (ADVIL,MOTRIN) 600 MG tablet Take 1 tablet (600 mg total) by mouth every 6 (six) hours as needed.  10/18/14   Julianne Rice, MD  loperamide (IMODIUM A-D) 2 MG tablet Take 2 mg by mouth 4 (four) times daily as needed. For diarrhea    Historical Provider, MD  medroxyPROGESTERone (DEPO-PROVERA) 150 MG/ML injection Inject 150 mg into the muscle every 3 (three) months.    Historical Provider, MD  methocarbamol (ROBAXIN) 500 MG tablet Take 2 tablets (1,000 mg total) by mouth every 8 (eight) hours as needed for muscle spasms. 10/18/14   Julianne Rice, MD  metroNIDAZOLE (FLAGYL) 500 MG tablet  Take 1 tablet (500 mg total) by mouth 2 (two) times daily. Take 1 pill twice daily for one week. NO alcohol Patient not taking: Reported on 10/18/2014 07/21/12   Darreld Mclean, MD    Triage Vitals: BP 120/75 mmHg  Pulse 81  Temp(Src) 98.5 F (36.9 C) (Oral)  Resp 20  Ht 5\' 8"  (1.727 m)  Wt 149 lb (67.586 kg)  BMI 22.66 kg/m2  SpO2 99%  LMP 09/19/2014  Physical Exam  Constitutional: She is oriented to person, place, and time. She appears well-developed and well-nourished. No distress.  HENT:  Head: Normocephalic and atraumatic.  Eyes: Conjunctivae and EOM are normal.  Neck: Neck supple. No tracheal deviation present.  Cardiovascular: Normal rate.   Pulmonary/Chest: Effort normal. No respiratory distress. She has no wheezes. She has no rales. She exhibits tenderness (tenderness with palpation of the lower anterior chest just inferior to the right breast and into the axilla. There is no crepitance or deformity. There is no obvious trauma.).  Abdominal: She exhibits no distension and no mass. There is no tenderness. There is no rebound and no guarding.  Musculoskeletal: Normal range of motion. She exhibits tenderness (patient has tenderness to palpation over the posterior lower ribs on the right side.).  NO calf swelling or tenderness. Distal pulses intact  Neurological: She is alert and oriented to person, place, and time.  Moved all extremities without deficit. Sensation is grossly intact.  Skin: Skin is warm and dry.  Psychiatric: She has a normal mood and affect. Her behavior is normal.  Nursing note and vitals reviewed.   ED Course  Procedures (including critical care time)  DIAGNOSTIC STUDIES: Oxygen Saturation is 99% on room air, normal by my interpretation.    COORDINATION OF CARE: At 0310 Discussed treatment plan with patient which includes imaging evaluation. Patient agrees.   Labs Review Labs Reviewed - No data to display  Imaging Review Dg Chest 2  View  10/18/2014   CLINICAL DATA:  Cough, right-sided chest pain.  EXAM: CHEST  2 VIEW  COMPARISON:  05/08/2004  FINDINGS: The cardiomediastinal contours are normal. The lungs are clear. Pulmonary vasculature is normal. No consolidation, pleural effusion, or pneumothorax. No acute osseous abnormalities are seen. Right ribs as visualized are intact.  IMPRESSION: No acute pulmonary process.   Electronically Signed   By: Jeb Levering M.D.   On: 10/18/2014 03:08     EKG Interpretation None      MDM   Final diagnoses:  Right-sided chest wall pain    I personally performed the services described in this documentation, which was scribed in my presence. The recorded information has been reviewed and is accurate.   Chest x-ray without any acute findings. Low suspicion for PE. Pain is reproduced with palpation and movement. Will treat for chest wall pain. Return precautions given.  Julianne Rice, MD 10/18/14 [phone]

## 2014-10-18 NOTE — ED Notes (Signed)
Pt reports dry cough onset 2 days ago. Pt then developed R side chest pain that increases with deep breathing.

## 2015-01-08 ENCOUNTER — Other Ambulatory Visit (HOSPITAL_COMMUNITY)
Admission: RE | Admit: 2015-01-08 | Discharge: 2015-01-08 | Disposition: A | Discharge status: Home / Self Care | Payer: 59 | Source: Ambulatory Visit | Attending: Obstetrics & Gynecology | Admitting: Obstetrics & Gynecology

## 2015-01-08 ENCOUNTER — Other Ambulatory Visit: Payer: Self-pay | Admitting: Obstetrics & Gynecology

## 2015-01-08 DIAGNOSIS — Z01411 Encounter for gynecological examination (general) (routine) with abnormal findings: Secondary | ICD-10-CM | POA: Insufficient documentation

## 2015-01-08 DIAGNOSIS — Z1151 Encounter for screening for human papillomavirus (HPV): Secondary | ICD-10-CM | POA: Diagnosis present

## 2015-01-08 DIAGNOSIS — Z113 Encounter for screening for infections with a predominantly sexual mode of transmission: Secondary | ICD-10-CM | POA: Diagnosis present

## 2015-01-10 LAB — CYTOLOGY - PAP

## 2015-02-10 ENCOUNTER — Other Ambulatory Visit: Payer: Self-pay | Admitting: Advanced Practice Midwife

## 2015-05-07 ENCOUNTER — Emergency Department (HOSPITAL_COMMUNITY): Payer: Medicaid Other

## 2015-05-07 ENCOUNTER — Emergency Department (HOSPITAL_COMMUNITY)
Admission: EM | Admit: 2015-05-07 | Discharge: 2015-05-07 | Disposition: A | Discharge status: Home / Self Care | Payer: Medicaid Other | Attending: Emergency Medicine | Admitting: Emergency Medicine

## 2015-05-07 ENCOUNTER — Encounter (HOSPITAL_COMMUNITY): Payer: Self-pay

## 2015-05-07 DIAGNOSIS — N730 Acute parametritis and pelvic cellulitis: Secondary | ICD-10-CM

## 2015-05-07 DIAGNOSIS — N898 Other specified noninflammatory disorders of vagina: Secondary | ICD-10-CM

## 2015-05-07 DIAGNOSIS — Z79899 Other long term (current) drug therapy: Secondary | ICD-10-CM | POA: Insufficient documentation

## 2015-05-07 DIAGNOSIS — B373 Candidiasis of vulva and vagina: Secondary | ICD-10-CM | POA: Diagnosis not present

## 2015-05-07 DIAGNOSIS — R110 Nausea: Secondary | ICD-10-CM

## 2015-05-07 DIAGNOSIS — Z3202 Encounter for pregnancy test, result negative: Secondary | ICD-10-CM | POA: Insufficient documentation

## 2015-05-07 DIAGNOSIS — N739 Female pelvic inflammatory disease, unspecified: Secondary | ICD-10-CM | POA: Diagnosis not present

## 2015-05-07 DIAGNOSIS — R102 Pelvic and perineal pain unspecified side: Secondary | ICD-10-CM

## 2015-05-07 DIAGNOSIS — R1030 Lower abdominal pain, unspecified: Secondary | ICD-10-CM

## 2015-05-07 DIAGNOSIS — Z862 Personal history of diseases of the blood and blood-forming organs and certain disorders involving the immune mechanism: Secondary | ICD-10-CM | POA: Diagnosis not present

## 2015-05-07 DIAGNOSIS — R1032 Left lower quadrant pain: Secondary | ICD-10-CM | POA: Diagnosis present

## 2015-05-07 DIAGNOSIS — D271 Benign neoplasm of left ovary: Secondary | ICD-10-CM

## 2015-05-07 DIAGNOSIS — B3731 Acute candidiasis of vulva and vagina: Secondary | ICD-10-CM

## 2015-05-07 LAB — COMPREHENSIVE METABOLIC PANEL
ALBUMIN: 3.4 g/dL — AB (ref 3.5–5.0)
ALK PHOS: 53 U/L (ref 38–126)
ALT: 11 U/L — ABNORMAL LOW (ref 14–54)
ANION GAP: 10 (ref 5–15)
AST: 12 U/L — ABNORMAL LOW (ref 15–41)
BILIRUBIN TOTAL: 0.5 mg/dL (ref 0.3–1.2)
BUN: <5 mg/dL — ABNORMAL LOW (ref 6–20)
CALCIUM: 8.9 mg/dL (ref 8.9–10.3)
CO2: 24 mmol/L (ref 22–32)
Chloride: 104 mmol/L (ref 101–111)
Creatinine, Ser: 0.75 mg/dL (ref 0.44–1.00)
GFR calc Af Amer: >60 mL/min (ref >60)
GLUCOSE: 96 mg/dL (ref 65–99)
POTASSIUM: 3.5 mmol/L (ref 3.5–5.1)
Sodium: 138 mmol/L (ref 135–145)
TOTAL PROTEIN: 6.5 g/dL (ref 6.5–8.1)

## 2015-05-07 LAB — URINALYSIS, ROUTINE W REFLEX MICROSCOPIC
Bilirubin Urine: NEGATIVE
GLUCOSE, UA: NEGATIVE mg/dL
KETONES UR: NEGATIVE mg/dL
Nitrite: NEGATIVE
Protein, ur: NEGATIVE mg/dL
Specific Gravity, Urine: 1.015 (ref 1.005–1.030)
Urobilinogen, UA: 0.2 mg/dL (ref 0.0–1.0)
pH: 7 (ref 5.0–8.0)

## 2015-05-07 LAB — CBC
HEMATOCRIT: 37.7 % (ref 36.0–46.0)
Hemoglobin: 12.5 g/dL (ref 12.0–15.0)
MCH: 29.9 pg (ref 26.0–34.0)
MCHC: 33.2 g/dL (ref 30.0–36.0)
MCV: 90.2 fL (ref 78.0–100.0)
Platelets: 256 10*3/uL (ref 150–400)
RBC: 4.18 MIL/uL (ref 3.87–5.11)
RDW: 13.5 % (ref 11.5–15.5)
WBC: 10.1 10*3/uL (ref 4.0–10.5)

## 2015-05-07 LAB — WET PREP, GENITAL
Clue Cells Wet Prep HPF POC: NONE SEEN
Trich, Wet Prep: NONE SEEN

## 2015-05-07 LAB — GC/CHLAMYDIA PROBE AMP (~~LOC~~) NOT AT ARMC
Chlamydia: NEGATIVE
Neisseria Gonorrhea: NEGATIVE

## 2015-05-07 LAB — POC URINE PREG, ED: PREG TEST UR: NEGATIVE

## 2015-05-07 LAB — URINE MICROSCOPIC-ADD ON

## 2015-05-07 LAB — LIPASE, BLOOD: Lipase: 26 U/L (ref 22–51)

## 2015-05-07 LAB — HIV ANTIBODY (ROUTINE TESTING W REFLEX): HIV Screen 4th Generation wRfx: NONREACTIVE

## 2015-05-07 LAB — RPR: RPR: NONREACTIVE

## 2015-05-07 MED ORDER — LIDOCAINE HCL (PF) 1 % IJ SOLN
0.9000 mL | Freq: Once | INTRAMUSCULAR | Status: AC
  Administered 2015-05-07: 0.9 mL via INTRADERMAL
  Filled 2015-05-07: qty 5

## 2015-05-07 MED ORDER — AZITHROMYCIN 250 MG PO TABS
1000.0000 mg | ORAL_TABLET | Freq: Once | ORAL | Status: AC
  Administered 2015-05-07: 1000 mg via ORAL
  Filled 2015-05-07: qty 4

## 2015-05-07 MED ORDER — IOHEXOL 300 MG/ML  SOLN
100.0000 mL | Freq: Once | INTRAMUSCULAR | Status: AC | PRN
Start: 2015-05-07 — End: 2015-05-07
  Administered 2015-05-07: 100 mL via INTRAVENOUS

## 2015-05-07 MED ORDER — ONDANSETRON 4 MG PO TBDP
4.0000 mg | ORAL_TABLET | Freq: Once | ORAL | Status: AC | PRN
  Administered 2015-05-07: 4 mg via ORAL
  Filled 2015-05-07: qty 1

## 2015-05-07 MED ORDER — FLUCONAZOLE 100 MG PO TABS
150.0000 mg | ORAL_TABLET | Freq: Once | ORAL | Status: AC
  Administered 2015-05-07: 150 mg via ORAL
  Filled 2015-05-07: qty 2

## 2015-05-07 MED ORDER — MORPHINE SULFATE (PF) 4 MG/ML IV SOLN
4.0000 mg | Freq: Once | INTRAVENOUS | Status: AC
  Administered 2015-05-07: 4 mg via INTRAVENOUS
  Filled 2015-05-07: qty 1

## 2015-05-07 MED ORDER — NAPROXEN 500 MG PO TABS: 500.0000 mg | ORAL_TABLET | Freq: Two times a day (BID) | ORAL | Status: DC | PRN

## 2015-05-07 MED ORDER — IOHEXOL 300 MG/ML  SOLN
25.0000 mL | INTRAMUSCULAR | Status: AC
  Administered 2015-05-07: 25 mL via ORAL

## 2015-05-07 MED ORDER — DOXYCYCLINE HYCLATE 100 MG PO CAPS: 100.0000 mg | ORAL_CAPSULE | Freq: Two times a day (BID) | ORAL | Status: DC

## 2015-05-07 MED ORDER — HYDROCODONE-ACETAMINOPHEN 5-325 MG PO TABS
1.0000 | ORAL_TABLET | Freq: Once | ORAL | Status: AC
  Administered 2015-05-07: 1 via ORAL
  Filled 2015-05-07: qty 1

## 2015-05-07 MED ORDER — HYDROCODONE-ACETAMINOPHEN 5-325 MG PO TABS: 1.0000 | ORAL_TABLET | Freq: Four times a day (QID) | ORAL | Status: DC | PRN

## 2015-05-07 MED ORDER — CEFTRIAXONE SODIUM 250 MG IJ SOLR
250.0000 mg | Freq: Once | INTRAMUSCULAR | Status: AC
  Administered 2015-05-07: 250 mg via INTRAMUSCULAR
  Filled 2015-05-07: qty 250

## 2015-05-07 MED ORDER — SODIUM CHLORIDE 0.9 % IV BOLUS (SEPSIS)
1000.0000 mL | Freq: Once | INTRAVENOUS | Status: AC
  Administered 2015-05-07: 1000 mL via INTRAVENOUS

## 2015-05-07 MED ORDER — IOHEXOL 300 MG/ML  SOLN: 25.0000 mL | Freq: Once | INTRAMUSCULAR | Status: DC | PRN

## 2015-05-07 MED ORDER — ONDANSETRON HCL 8 MG PO TABS: 8.0000 mg | ORAL_TABLET | Freq: Three times a day (TID) | ORAL | Status: DC | PRN

## 2015-05-07 NOTE — Discharge Instructions (Signed)
Your STD tests were all negative. Your exams showed that you have a dermoid cyst on your left ovary which is likely causing your pain. Given your vaginal discharge and pain, you are being treated for pelvic inflammatory disease. Take antibiotic until finished. Use zofran as needed for nausea, and use naprosyn and norco as directed as needed for pain. Follow up with your OBGYN for ongoing evaluation and management of your complaints in 1 week. Return to the ER for changes or worsening symptoms.   Abdominal Pain Many things can cause belly (abdominal) pain. Most times, the belly pain is not dangerous. Many cases of belly pain can be watched and treated at home. HOME CARE   Do not take medicines that help you go poop (laxatives) unless told to by your doctor.  Only take medicine as told by your doctor.  Eat or drink as told by your doctor. Your doctor will tell you if you should be on a special diet. GET HELP IF:  You do not know what is causing your belly pain.  You have belly pain while you are sick to your stomach (nauseous) or have runny poop (diarrhea).  You have pain while you pee or poop.  Your belly pain wakes you up at night.  You have belly pain that gets worse or better when you eat.  You have belly pain that gets worse when you eat fatty foods.  You have a fever. GET HELP RIGHT AWAY IF:   The pain does not go away within 2 hours.  You keep throwing up (vomiting).  The pain changes and is only in the right or left part of the belly.  You have bloody or tarry looking poop. MAKE SURE YOU:   Understand these instructions.  Will watch your condition.  Will get help right away if you are not doing well or get worse. Document Released: 02/17/2008 Document Revised: 09/05/2013 Document Reviewed: 05/10/2013 [facility] Patient Information 2015 [place], [place]. This information is not intended to replace advice given to you by your health care provider. Make sure you discuss  any questions you have with your health care provider.  Nausea, Adult Nausea is the feeling that you have an upset stomach or have to vomit. Nausea by itself is not likely a serious concern, but it may be an early sign of more serious medical problems. As nausea gets worse, it can lead to vomiting. If vomiting develops, there is the risk of dehydration.  CAUSES   Viral infections.  Food poisoning.  Medicines.  Pregnancy.  Motion sickness.  Migraine headaches.  Emotional distress.  Severe pain from any source.  Alcohol intoxication. HOME CARE INSTRUCTIONS  Get plenty of rest.  Ask your caregiver about specific rehydration instructions.  Eat small amounts of food and sip liquids more often.  Take all medicines as told by your caregiver. SEEK MEDICAL CARE IF:  You have not improved after 2 days, or you get worse.  You have a headache. SEEK IMMEDIATE MEDICAL CARE IF:   You have a fever.  You faint.  You keep vomiting or have blood in your vomit.  You are extremely weak or dehydrated.  You have dark or bloody stools.  You have severe chest or abdominal pain. MAKE SURE YOU:  Understand these instructions.  Will watch your condition.  Will get help right away if you are not doing well or get worse. Document Released: 10/08/2004 Document Revised: 05/25/2012 Document Reviewed: 05/13/2011 [facility] Patient Information 2015 [place], [place]. This  information is not intended to replace advice given to you by your health care provider. Make sure you discuss any questions you have with your health care provider.  Ovarian Cyst An ovarian cyst is a sac filled with fluid or blood. This sac is attached to the ovary. Some cysts go away on their own. Other cysts need treatment.  HOME CARE   Only take medicine as told by your doctor.  Follow up with your doctor as told.  Get regular pelvic exams and Pap tests. GET HELP IF:  Your periods are late, not regular, or  painful.  You stop having periods.  Your belly (abdominal) or pelvic pain does not go away.  Your belly becomes large or puffy (swollen).  You have a hard time peeing (totally emptying your bladder).  You have pressure on your bladder.  You have pain during sex.  You feel fullness, pressure, or discomfort in your belly.  You lose weight for no reason.  You feel sick most of the time.  You have a hard time pooping (constipation).  You do not feel like eating.  You develop pimples (acne).  You have an increase in hair on your body and face.  You are gaining weight for no reason.  You think you are pregnant. GET HELP RIGHT AWAY IF:   Your belly pain gets worse.  You feel sick to your stomach (nauseous), and you throw up (vomit).  You have a fever that comes on fast.  You have belly pain while pooping (bowel movement).  Your periods are heavier than usual. MAKE SURE YOU:   Understand these instructions.  Will watch your condition.  Will get help right away if you are not doing well or get worse. Document Released: 02/17/2008 Document Revised: 06/21/2013 Document Reviewed: 05/08/2013 [facility] Patient Information 2015 [place], [place]. This information is not intended to replace advice given to you by your health care provider. Make sure you discuss any questions you have with your health care provider.  Pelvic Inflammatory Disease Pelvic inflammatory disease (PID) refers to an infection in some or all of the female organs. The infection can be in the uterus, ovaries, fallopian tubes, or the surrounding tissues in the pelvis. PID can cause abdominal or pelvic pain that comes on suddenly (acute pelvic pain). PID is a serious infection because it can lead to lasting (chronic) pelvic pain or the inability to have children (infertile).  CAUSES  The infection is often caused by the normal bacteria found in the vaginal tissues. PID may also be caused by an infection that is  spread during sexual contact. PID can also occur following:   The birth of a baby.   A miscarriage.   An abortion.   Major pelvic surgery.   The use of an intrauterine device (IUD).   A sexual assault.  RISK FACTORS Certain factors can put a person at higher risk for PID, such as:  Being younger than 25 years.  Being sexually active at [place] age.  Usingnonbarrier contraception.  Havingmultiple sexual partners.  Having sex with someone who has symptoms of a genital infection.  Using oral contraception. Other times, certain behaviors can increase the possibility of getting PID, such as:  Having sex during your period.  Using a vaginal douche.  Having an intrauterine device (IUD) in place. SYMPTOMS   Abdominal or pelvic pain.   Fever.   Chills.   Abnormal vaginal discharge.  Abnormal uterine bleeding.   Unusual pain shortly after finishing your  period. DIAGNOSIS  Your caregiver will choose some of the following methods to make a diagnosis, such as:   Performinga physical exam and history. A pelvic exam typically reveals a very tender uterus and surrounding pelvis.   Ordering laboratory tests including a pregnancy test, blood tests, and urine test.  Orderingcultures of the vagina and cervix to check for a sexually transmitted infection (STI).  Performing an ultrasound.   Performing a laparoscopic procedure to look inside the pelvis.  TREATMENT   Antibiotic medicines may be prescribed and taken by mouth.   Sexual partners may be treated when the infection is caused by a sexually transmitted disease (STD).   Hospitalization may be needed to give antibiotics intravenously.  Surgery may be needed, but this is rare. It may take weeks until you are completely well. If you are diagnosed with PID, you should also be checked for human immunodeficiency virus (HIV). HOME CARE INSTRUCTIONS   If given, take your antibiotics as directed.  Finish the medicine even if you start to feel better.   Only take over-the-counter or prescription medicines for pain, discomfort, or fever as directed by your caregiver.   Do not have sexual intercourse until treatment is completed or as directed by your caregiver. If PID is confirmed, your recent sexual partner(s) will need treatment.   Keep your follow-up appointments. SEEK MEDICAL CARE IF:   You have increased or abnormal vaginal discharge.   You need prescription medicine for your pain.   You vomit.   You cannot take your medicines.   Your partner has an STD.  SEEK IMMEDIATE MEDICAL CARE IF:   You have a fever.   You have increased abdominal or pelvic pain.   You have chills.   You have pain when you urinate.   You are not better after 72 hours following treatment.  MAKE SURE YOU:   Understand these instructions.  Will watch your condition.  Will get help right away if you are not doing well or get worse. Document Released: 08/31/2005 Document Revised: 12/26/2012 Document Reviewed: 08/27/2011 [facility] Patient Information 2015 [place], [place]. This information is not intended to replace advice given to you by your health care provider. Make sure you discuss any questions you have with your health care provider.

## 2015-05-07 NOTE — ED Notes (Signed)
Patient transported to Ultrasound 

## 2015-05-07 NOTE — ED Notes (Addendum)
Pt states lower abd pain has been going on for over a day now, c/o nausea, no vomiting. Pt states it hurts to even touch her belly button. Pt denies dysuria, states abnormal discharge and a little bleeding.

## 2015-05-07 NOTE — ED Provider Notes (Signed)
CSN: [id number]     Arrival date & time 05/07/15  0454 History   First MD Initiated Contact with Patient 05/07/15 [phone]     Chief Complaint  Patient presents with  . Abdominal Pain     (Consider location/radiation/quality/duration/timing/severity/associated sxs/prior Treatment) HPI Comments: Courtney [name] is a 34 y.o. female with a PMHx of anemia, ovarian cysts, and remote hx of chlamydia, who presents to the ED with complaints of 2 days of gradual onset lower abdominal pain. She reports the pain is 8/10 constant nonradiating stabbing pain worse with sitting or movement, and unrelieved with simethacon, heat, ibuprofen, and Robaxin. Associated symptoms include nausea, thick white nonodorous vaginal discharge, and breakthrough vaginal bleeding since putting in her NuvaRing. She denies any fevers, chills, chest pain, shortness of breath, vomiting, diarrhea, constipation, melena, hematochezia, obstipation, dysuria, hematuria, vaginal itching, genital lesions, numbness, tingling, weakness, recent travel, sick contacts, suspicious food intake, alcohol use, NSAID use, or recent antibiotics. Last menstrual period was 05/02/15, although she states that usually she gets her period when she takes out her NuvaRing, and since putting in her NuvaRing she has had some more vaginal bleeding. She is sexually active with 2 female partners, unprotected. She states this doesn't feel like her prior ovarian cysts.   Patient is a 34 y.o. female presenting with abdominal pain. The history is provided by the patient. No language interpreter was used.  Abdominal Pain Pain location:  LLQ, RLQ and suprapubic Pain quality: stabbing   Pain radiates to:  Does not radiate Pain severity:  Moderate Onset quality:  Gradual Duration:  2 days Timing:  Constant Progression:  Unchanged Chronicity:  New Context: not recent travel, not sick contacts and not suspicious food intake   Relieved by:  Nothing Worsened by:  Movement and  position changes Ineffective treatments:  OTC medications, heat and NSAIDs Associated symptoms: nausea, vaginal bleeding and vaginal discharge   Associated symptoms: no chest pain, no chills, no constipation, no diarrhea, no dysuria, no fever, no flatus, no hematemesis, no hematochezia, no hematuria, no melena, no shortness of breath and no vomiting   Risk factors: no alcohol abuse, has not had multiple surgeries and no NSAID use     Past Medical History  Diagnosis Date  . Anemia    History reviewed. No pertinent past surgical history. Family History  Problem Relation Age of Onset  . Hypertension Mother   . Hyperlipidemia Mother   . Hypertension Father   . Heart murmur Sister   . Cancer Maternal Grandmother   . Cancer Maternal Grandfather   . Stroke Paternal Grandmother   . Cancer Paternal Grandfather    Social History  Substance Use Topics  . Smoking status: Never Smoker   . Smokeless tobacco: None  . Alcohol Use: No   OB History    No data available     Review of Systems  Constitutional: Negative for fever and chills.  Respiratory: Negative for shortness of breath.   Cardiovascular: Negative for chest pain.  Gastrointestinal: Positive for nausea and abdominal pain. Negative for vomiting, diarrhea, constipation, blood in stool, melena, hematochezia, flatus and hematemesis.  Genitourinary: Positive for vaginal bleeding and vaginal discharge. Negative for dysuria, hematuria and genital sores.  Musculoskeletal: Negative for myalgias and arthralgias.  Skin: Negative for color change.  Allergic/Immunologic: Negative for immunocompromised state.  Neurological: Negative for weakness and numbness.  Psychiatric/Behavioral: Negative for confusion.   10 Systems reviewed and are negative for acute change except as noted in the HPI.  Allergies  Bee venom  Home Medications   Prior to Admission medications   Medication Sig Start Date End Date Taking? Authorizing Provider    acetaminophen (TYLENOL) 325 MG tablet Take 650 mg by mouth every 6 (six) hours as needed for mild pain.   Yes Historical Provider, MD  bismuth subsalicylate (PEPTO BISMOL) 262 MG/15ML suspension Take 15 mLs by mouth every 6 (six) hours as needed. For diarrhea   Yes Historical Provider, MD  ibuprofen (ADVIL,MOTRIN) 600 MG tablet Take 1 tablet (600 mg total) by mouth every 6 (six) hours as needed. Patient taking differently: Take 600 mg by mouth every 6 (six) hours as needed for moderate pain.  10/18/14  Yes Julianne Rice, MD  loperamide (IMODIUM A-D) 2 MG tablet Take 2 mg by mouth 4 (four) times daily as needed. For diarrhea   Yes Historical Provider, MD  methocarbamol (ROBAXIN) 500 MG tablet Take 2 tablets (1,000 mg total) by mouth every 8 (eight) hours as needed for muscle spasms. 10/18/14  Yes Julianne Rice, MD  NUVARING 0.12-0.015 MG/24HR vaginal ring Place 1 each vaginally every 28 (twenty-eight) days.  09/25/14  Yes Historical Provider, MD   Initial VS: BP 116/71 mmHg  Pulse 88  Ht 5\' 8"  (1.727 m)  Wt 151 lb (68.493 kg)  BMI 22.96 kg/m2  SpO2 100%  LMP 05/02/2015 Remainder of VS: Temp(Src) 98.2 F (36.8 C) (Oral)  Resp 20 Physical Exam  Constitutional: She is oriented to person, place, and time. Vital signs are normal. She appears well-developed and well-nourished.  Non-toxic appearance. No distress.  Afebrile, nontoxic, NAD  HENT:  Head: Normocephalic and atraumatic.  Mouth/Throat: Oropharynx is clear and moist and mucous membranes are normal.  Eyes: Conjunctivae and EOM are normal. Right eye exhibits no discharge. Left eye exhibits no discharge.  Neck: Normal range of motion. Neck supple.  Cardiovascular: Normal rate, regular rhythm, normal heart sounds and intact distal pulses.  Exam reveals no gallop and no friction rub.   No murmur heard. Pulmonary/Chest: Effort normal and breath sounds normal. No respiratory distress. She has no decreased breath sounds. She has no wheezes. She  has no rhonchi. She has no rales.  Abdominal: Soft. Normal appearance and bowel sounds are normal. She exhibits no distension. There is tenderness in the right lower quadrant, suprapubic area and left lower quadrant. There is no rigidity, no rebound, no guarding, no CVA tenderness, no tenderness at McBurney's point and negative Murphy's sign.    Soft, nondistended, +BS throughout, with diffuse lower abd TTP, no r/g/r, neg murphy's, neg mcburney's, no CVA TTP. Neg foot tap test, neg psoas sign  Genitourinary: Uterus normal. Pelvic exam was performed with patient supine. There is no rash, tenderness or lesion on the right labia. There is no rash, tenderness or lesion on the left labia. Cervix exhibits motion tenderness, discharge and friability. Right adnexum displays no mass, no tenderness and no fullness. Left adnexum displays tenderness and fullness. Left adnexum displays no mass. No erythema, tenderness or bleeding in the vagina. No foreign body around the vagina. No signs of injury around the vagina. Vaginal discharge found.  Chaperone present for exam. No rashes, lesions, or tenderness to external genitalia. No erythema, injury, or tenderness to vaginal mucosa. +Copious amounts of mucoid vaginal discharge without vaginal bleeding within vaginal vault. No adnexal masses but with L sided adnexal tenderness and fullness. +CMT, +cervical friability, +mucoid discharge from cervical os. Uterus non-deviated, mobile, and without enlargement.    Musculoskeletal: Normal range of motion.  Neurological: She is alert and oriented to person, place, and time. She has normal strength. No sensory deficit.  Skin: Skin is warm, dry and intact. No rash noted.  Psychiatric: She has a normal mood and affect.  Nursing note and vitals reviewed.   ED Course  Procedures (including critical care time) Labs Review Labs Reviewed  WET PREP, GENITAL - Abnormal; Notable for the following:    Yeast Wet Prep HPF POC FEW (*)      WBC, Wet Prep HPF POC TOO NUMEROUS TO COUNT (*)    All other components within normal limits  URINALYSIS, ROUTINE W REFLEX MICROSCOPIC (NOT AT [facility]) - Abnormal; Notable for the following:    APPearance CLOUDY (*)    Hgb urine dipstick LARGE (*)    Leukocytes, UA LARGE (*)    All other components within normal limits  COMPREHENSIVE METABOLIC PANEL - Abnormal; Notable for the following:    BUN <5 (*)    Albumin 3.4 (*)    AST 12 (*)    ALT 11 (*)    All other components within normal limits  URINE MICROSCOPIC-ADD ON - Abnormal; Notable for the following:    Squamous Epithelial / LPF MANY (*)    All other components within normal limits  LIPASE, BLOOD  CBC  RPR  HIV ANTIBODY (ROUTINE TESTING)  POC URINE PREG, ED  GC/CHLAMYDIA PROBE AMP ([place]) NOT AT [facility]    Imaging Review US Transvaginal Non-ob  05/07/2015   CLINICAL DATA:  Pelvic pain since yesterday.  EXAM: TRANSABDOMINAL AND TRANSVAGINAL ULTRASOUND OF PELVIS  DOPPLER ULTRASOUND OF OVARIES  TECHNIQUE: Both transabdominal and transvaginal ultrasound examinations of the pelvis were performed. Transabdominal technique was performed for global imaging of the pelvis including uterus, ovaries, adnexal regions, and pelvic cul-de-sac.  It was necessary to proceed with endovaginal exam following the transabdominal exam to visualize the endometrium and ovaries. Color and duplex Doppler ultrasound was utilized to evaluate blood flow to the ovaries.  COMPARISON:  None.  FINDINGS: Uterus  Measurements: 4.4 x 4.7 x 10.8 cm. No fibroids or other mass visualized. Small amount of fluid over the endocervical canal.  Endometrium  Thickness: 4 mm.  No focal abnormality visualized.  Right ovary  Measurements: 1.8 x 2.2 x 3.3 cm. Normal appearance/no adnexal mass.  Left ovary  Measurements: 5.4 x 7.4 x 7.8 cm. The entire left ovary is taken up by a heterogeneous mass measuring 5.4 x 7.4 x 7.8 cm is only a tiny rim of normal ovarian tissue is visualized.  This mass has a cystic component as well as echogenic components with areas of increased through transmission as well as far field attenuation of the sound waves as this appearance is most typical of a dermoid cyst. This mass has no significant internal vascularity.  Pulsed Doppler evaluation of both ovaries demonstrates normal low-resistance arterial and venous waveforms.  Other findings  No free fluid.  IMPRESSION: Large heterogeneous mass involving most of the left ovary measuring 5.4 x 7.4 x 7.8 cm most typical in appearance for a dermoid cyst. Recommend CT of the pelvis for further evaluation. No evidence of ovarian torsion.   Electronically Signed   By: Marin Olp M.D.   On: 05/07/2015 10:25   US Pelvis Complete  05/07/2015   CLINICAL DATA:  Pelvic pain since yesterday.  EXAM: TRANSABDOMINAL AND TRANSVAGINAL ULTRASOUND OF PELVIS  DOPPLER ULTRASOUND OF OVARIES  TECHNIQUE: Both transabdominal and transvaginal ultrasound examinations of the pelvis were performed. Transabdominal technique  was performed for global imaging of the pelvis including uterus, ovaries, adnexal regions, and pelvic cul-de-sac.  It was necessary to proceed with endovaginal exam following the transabdominal exam to visualize the endometrium and ovaries. Color and duplex Doppler ultrasound was utilized to evaluate blood flow to the ovaries.  COMPARISON:  None.  FINDINGS: Uterus  Measurements: 4.4 x 4.7 x 10.8 cm. No fibroids or other mass visualized. Small amount of fluid over the endocervical canal.  Endometrium  Thickness: 4 mm.  No focal abnormality visualized.  Right ovary  Measurements: 1.8 x 2.2 x 3.3 cm. Normal appearance/no adnexal mass.  Left ovary  Measurements: 5.4 x 7.4 x 7.8 cm. The entire left ovary is taken up by a heterogeneous mass measuring 5.4 x 7.4 x 7.8 cm is only a tiny rim of normal ovarian tissue is visualized. This mass has a cystic component as well as echogenic components with areas of increased through  transmission as well as far field attenuation of the sound waves as this appearance is most typical of a dermoid cyst. This mass has no significant internal vascularity.  Pulsed Doppler evaluation of both ovaries demonstrates normal low-resistance arterial and venous waveforms.  Other findings  No free fluid.  IMPRESSION: Large heterogeneous mass involving most of the left ovary measuring 5.4 x 7.4 x 7.8 cm most typical in appearance for a dermoid cyst. Recommend CT of the pelvis for further evaluation. No evidence of ovarian torsion.   Electronically Signed   By: Marin Olp M.D.   On: 05/07/2015 10:25   Ct Abdomen Pelvis W Contrast  05/07/2015   CLINICAL DATA:  LEFT adnexal tenderness, vaginal discharge, abnormal ultrasound showing possible dermoid cyst LEFT adnexa  EXAM: CT ABDOMEN AND PELVIS WITH CONTRAST  TECHNIQUE: Multidetector CT imaging of the abdomen and pelvis was performed using the standard protocol following bolus administration of intravenous contrast. Sagittal and coronal MPR images reconstructed from axial data set.  CONTRAST:  134mL OMNIPAQUE IOHEXOL 300 MG/ML SOLN IV. Dilute oral contrast.  COMPARISON:  Pelvic ultrasound 05/07/2015  FINDINGS: Dependent bibasilar atelectasis.  Probable tiny cyst RIGHT lobe liver image 14.  Liver, gallbladder, spleen, pancreas, kidneys, and adrenal glands otherwise normal appearance. Normal appendix.  Normal appearing bladder, uterus, RIGHT adnexa and ureters.  Complex LEFT adnexal mass 7.4 x 5.3 x 5.7 cm containing fat, soft tissue and calcification consistent with dermoid tumor.  Stomach and bowel loops normal appearance.  Small umbilical hernia containing fat.  No additional mass, adenopathy, free air or free fluid.  Suspected diaphragm within vagina.  Osseous structures unremarkable.  IMPRESSION: Bibasilar atelectasis.  Complex LEFT adnexal mass with characteristics consistent with LEFT ovarian dermoid tumor.   Electronically Signed   By: Lavonia Dana M.D.    On: 05/07/2015 14:57   [place] Art/ven Flow Abd Pelv Doppler  05/07/2015   CLINICAL DATA:  Pelvic pain since yesterday.  EXAM: TRANSABDOMINAL AND TRANSVAGINAL ULTRASOUND OF PELVIS  DOPPLER ULTRASOUND OF OVARIES  TECHNIQUE: Both transabdominal and transvaginal ultrasound examinations of the pelvis were performed. Transabdominal technique was performed for global imaging of the pelvis including uterus, ovaries, adnexal regions, and pelvic cul-de-sac.  It was necessary to proceed with endovaginal exam following the transabdominal exam to visualize the endometrium and ovaries. Color and duplex Doppler ultrasound was utilized to evaluate blood flow to the ovaries.  COMPARISON:  None.  FINDINGS: Uterus  Measurements: 4.4 x 4.7 x 10.8 cm. No fibroids or other mass visualized. Small amount of fluid over the endocervical canal.  Endometrium  Thickness: 4 mm.  No focal abnormality visualized.  Right ovary  Measurements: 1.8 x 2.2 x 3.3 cm. Normal appearance/no adnexal mass.  Left ovary  Measurements: 5.4 x 7.4 x 7.8 cm. The entire left ovary is taken up by a heterogeneous mass measuring 5.4 x 7.4 x 7.8 cm is only a tiny rim of normal ovarian tissue is visualized. This mass has a cystic component as well as echogenic components with areas of increased through transmission as well as far field attenuation of the sound waves as this appearance is most typical of a dermoid cyst. This mass has no significant internal vascularity.  Pulsed Doppler evaluation of both ovaries demonstrates normal low-resistance arterial and venous waveforms.  Other findings  No free fluid.  IMPRESSION: Large heterogeneous mass involving most of the left ovary measuring 5.4 x 7.4 x 7.8 cm most typical in appearance for a dermoid cyst. Recommend CT of the pelvis for further evaluation. No evidence of ovarian torsion.   Electronically Signed   By: Marin Olp M.D.   On: 05/07/2015 10:25   I have personally reviewed and evaluated these images and lab  results as part of my medical decision-making.   EKG Interpretation None      MDM   Final diagnoses:  Lower abdominal pain  Nausea  Vaginal discharge  Yeast vaginitis  PID (acute pelvic inflammatory disease)  Dermoid cyst of left ovary    34 y.o. female here with low abd pain, nausea, vaginal discharge, and breakthrough vag bleeding x2 days. On exam, diffusely tender in lower abdomen, nonperitoneal. Labs unremarkable, upreg neg, u/a contaminated but with large leuks and 21-50 WBC and rare bacteria. Likely just a contaminant, but could be possible UTI given suprapubic tenderness. Will proceed with pelvic exam, STD check, and decide on imaging based on pelvic exam. Will give pain meds and fluids. Zofran given prior to eval. Will reassess shortly.   7:47 AM Pelvic exam revealing copious mucoid discharge from cervical os, +CMT, concerning for GC/CT. Will empirically treat and will treat for PID. Also with some L adnexal tenderness and fullness, will obtain u/s to eval etiology of this tenderness (TOA vs cyst vs other). Pt requesting more pain meds after pelvic, will give slightly more morphine since this helped previously. Will reassess shortly.   8:22 AM Wet prep with few yeast, will treat with one dose of diflucan. No trich or clue cells, but TNTC WBC which is consistent with GC/CT or PID. Awaiting U/S.   10:53 AM U/S returning showing a large heterogeneous mass on L ovary, which could be typical of dermoid cyst. Called radiologist who stated that it could still be a TOA and would need CT to further eval, since this would be diagnostic if fat is shown within dermoid cyst-- would also r/o TOA. Will proceed with CT. Pt currently comfortable without ongoing nausea, minimal pain. Will monitor and reassess shortly.   2:05 PM Called Radiology to see why CT is still not done. They report that she is next. Will monitor  3:16 PM HIV/RPR returned and were negative. GC/CT returned and were  negative as well. CT finally resulting, confirming that the L adnexal mass is a dermoid cyst measuring 7.4 x 5.3 x 5.7cm containing fat, soft tissue, and calcifications. This would explain her pain but not as much her discharge. Given the discharge with TNTC WBC on wet prep, in conjunction with +CMT, will still treat for PID. Will send home with zofran, pain meds, and  doxy. Will have her f/up with her OBGYN. I explained the diagnosis and have given explicit precautions to return to the ER including for any other new or worsening symptoms. The patient understands and accepts the medical plan as it's been dictated and I have answered their questions. Discharge instructions concerning home care and prescriptions have been given. The patient is STABLE and is discharged to home in good condition.  BP 112/68 mmHg  Pulse 99  Temp(Src) 98.2 F (36.8 C) (Oral)  Resp 18  Ht 5\' 8"  (1.727 m)  Wt 151 lb (68.493 kg)  BMI 22.96 kg/m2  SpO2 100%  LMP 05/02/2015  Meds ordered this encounter  Medications  . ondansetron (ZOFRAN-ODT) disintegrating tablet 4 mg    Sig:   . sodium chloride 0.9 % bolus 1,000 mL    Sig:   . morphine 4 MG/ML injection 4 mg    Sig:   . azithromycin (ZITHROMAX) tablet 1,000 mg    Sig:    And  . cefTRIAXone (ROCEPHIN) injection 250 mg    Sig:     Order Specific Question:  Antibiotic Indication:    Answer:  STD  . morphine 4 MG/ML injection 4 mg    Sig:   . lidocaine (PF) (XYLOCAINE) 1 % injection 0.9 mL    Sig:   . fluconazole (DIFLUCAN) tablet 150 mg    Sig:   . iohexol (OMNIPAQUE) 300 MG/ML solution 25 mL    Sig:   . iohexol (OMNIPAQUE) 300 MG/ML solution 25 mL    Sig:   . morphine 4 MG/ML injection 4 mg    Sig:   . iohexol (OMNIPAQUE) 300 MG/ML solution 100 mL    Sig:   . ondansetron (ZOFRAN) 8 MG tablet    Sig: Take 1 tablet (8 mg total) by mouth every 8 (eight) hours as needed for nausea or vomiting.    Dispense:  10 tablet    Refill:  0    Order Specific  Question:  Supervising Provider    Answer:  Sabra Heck, BRIAN [[id number]]  . HYDROcodone-acetaminophen (NORCO) 5-325 MG per tablet    Sig: Take 1 tablet by mouth every 6 (six) hours as needed for severe pain.    Dispense:  10 tablet    Refill:  0    Order Specific Question:  Supervising Provider    Answer:  Sabra Heck, BRIAN [[id number]]  . naproxen (NAPROSYN) 500 MG tablet    Sig: Take 1 tablet (500 mg total) by mouth 2 (two) times daily as needed for mild pain, moderate pain or headache (TAKE WITH MEALS.).    Dispense:  20 tablet    Refill:  0    Order Specific Question:  Supervising Provider    Answer:  MILLER, BRIAN [[id number]]  . doxycycline (VIBRAMYCIN) 100 MG capsule    Sig: Take 1 capsule (100 mg total) by mouth 2 (two) times daily. One po bid x 14 days    Dispense:  28 capsule    Refill:  0    Order Specific Question:  Supervising Provider    Answer:  Noemi Chapel [[id number]]     Lorette Peterkin Camprubi-Soms, PA-C 05/07/15 [place], DO 05/07/15 1526

## 2015-05-08 ENCOUNTER — Encounter (HOSPITAL_COMMUNITY): Payer: Self-pay | Admitting: *Deleted

## 2015-05-08 NOTE — H&P (Signed)
34yo G2P2 who presents for follow up regarding recent ER visit and acute abdominal pain. The patient reports that starting on Sunday, she all acute onset of sharp pelvic pain. She tried OTC medication with minimal improvement of her symptoms. She reports some nausea, no vomiting. Pt has never experienced pain like this before. She uses Nuva ring for contraception and reports no new partners. She did also start having some spotting with the pain.  Patient was seen in the ER on 8/23- EPIC chart reviewed: TVUS on 05/07/15: 10cm uterus, normal right ovary, left ovary: 5.4 x 7.4 x 7.8 cm. The entire left ovary is taken up by a heterogeneous mass measuring 5.4 x 7.4 x 7.8 cm is only a tiny rim of normal ovarian tissue is visualized. This mass has a cystic component as well as echogenic components with areas of increased through transmission as well as far field attenuation of the sound waves as this appearance is most typical of a dermoid cyst. This mass has no significant internal vascularity. Abd CT performed and confirmed dermoid: Complex LEFT adnexal mass 7.4 x 5.3 x 5.7 cm containing fat, soft tissue and calcification consistent with dermoid tumor. Patient doing a little better with Norco for pain management and wishes to proceed with surgical management as soon as possible due to discomfort.    Past Medical History  HPV  Anemia  Heavy menstrual bleeding  Intermittent lower back pain  left ovarian cyst dermoid tumor- CT abdomen pelvic, vaginal ultrasound- 8/16- in ER   Surgical History  none    Family History  Father: alive 34 yrs, diagnosed with HTN  Mother: alive 34 yrs  Paternal Sterling Father: deceased, diagnosed with Prostate Ca  Paternal Grand Mother: deceased, Massive Stroke Massive Stroke  Maternal Grand Father: deceased, diagnosed with Lung Ca  Maternal Grand Mother: deceased, diagnosed with Ovary Ca  Sister 1: alive  1 sister(s) . 2daughter(s) . [name]    Social History  General:   Tobacco use  cigarettes: Never smoked Tobacco history last updated 05/08/2015 Additional Findings: Tobacco Non-User Non-smoker for personal reasons Diet: yes.  no Exercise.  Marital Status: single.  Occupation: .- finish NP course- awting for job. in [place] for job.  Communication barriers: no hearing, vision or cognition issues.  Nutrition: good.  Dental care: good.  no Exposed to passive smoke.  no Tobacco Exposure.  no Alcohol.  no Caffeine.  no Recreational drug use.  Children: girls, 2.    Gyn History  Sexual activity currently sexually active.  Periods : Irregular.  LMP Since beginning of March, still bleeding.  Birth control Teacher, early years/pre.  Last pap smear date April 2016.  Menarche 25.    OB History  Number of pregnancies 2.  Pregnancy # 1 live birth, vaginal delivery, girl.  Pregnancy # 2 live birth, vaginal delivery, girl.    Allergies  Bee's   Hospitalization/Major Diagnostic Procedure  No Hospitalization History.   Review of Systems  CONSTITUTIONAL:  no Chills. no Fever. no Night sweats.  SKIN:  no Rash. no Hives.  HEENT:  Blurrred vision no. no Double vision.  CARDIOLOGY:  no Chest pain.  RESPIRATORY:  no Shortness of breath. no Cough.  ABDOMEN:  no Change in bowel movements.  UROLOGY:  no Urinary frequency. no Urinary incontinence. no Urinary urgency.  FEMALE REPRODUCTIVE:  no Breast lumps or discharge. no Breast pain. no Vaginal irritation. no Vaginal itching.  NEUROLOGY:  no Dizziness. no Headache. no Loss of consciousness.  PSYCHOLOGY:  no Depression. no Confusion.  HEMATOLOGY/LYMPH:  no Anemia. no Fatigue. Using Blood Thinners no.    O: (Performed on 05/08/15) Vital Signs  Wt 157, Ht 68, BMI 23.87, Temp 98.2, Pulse sitting 72, BP sitting 122/81.   Examination  General Examination: GENERAL APPEARANCE well developed, well nourished.  SKIN: warm and dry, no rashes.  NECK: supple, normal appearance.  LUNGS: regular breathing rate and  effort.  ABDOMEN: soft, diffuse lower abdominal pain with palpation, no rebound, no guarding.  FEMALE GENITOURINARY: normal external genitalia, labia - unremarkable, no CMT, +fullness appreciated in posterior cul-des-sac, mobile mass appreciated with moderate tenderness on exam.  MUSCULOSKELETAL no calf tenderness bilaterally.  EXTREMITIES: no edema present.  PSYCH appropriate mood and affect.     34yo G2P2 who presents for Diagnostic laparoscopy, left salpingo-oophorectomy due to dermoid cyst. -NPO -LR @ 125cc/hr -SCDs to OR -CBC, BMP to be obtained -Discussed risk, benefit and indications including risk of bleeding, infection and potential injury to surrounding organs. Due to the size of the cyst and minimal ovarian tissue unlikely to preserve ovarian tissue- pt aware and wishes to proceed with oophorectomy.   Janyth Pupa, DO [phone] (pager) [phone] (office)

## 2015-05-09 ENCOUNTER — Encounter (HOSPITAL_COMMUNITY)
Admission: RE | Disposition: A | Discharge status: Home / Self Care | Payer: Self-pay | Source: Ambulatory Visit | Attending: Obstetrics & Gynecology

## 2015-05-09 ENCOUNTER — Ambulatory Visit (HOSPITAL_COMMUNITY): Payer: Medicaid Other | Admitting: Anesthesiology

## 2015-05-09 ENCOUNTER — Encounter (HOSPITAL_COMMUNITY): Payer: Self-pay | Admitting: Anesthesiology

## 2015-05-09 ENCOUNTER — Ambulatory Visit (HOSPITAL_COMMUNITY)
Admission: RE | Admit: 2015-05-09 | Discharge: 2015-05-09 | Disposition: A | Discharge status: Home / Self Care | Payer: Medicaid Other | Source: Ambulatory Visit | Attending: Obstetrics & Gynecology | Admitting: Obstetrics & Gynecology

## 2015-05-09 DIAGNOSIS — Z91030 Bee allergy status: Secondary | ICD-10-CM | POA: Insufficient documentation

## 2015-05-09 DIAGNOSIS — D649 Anemia, unspecified: Secondary | ICD-10-CM | POA: Diagnosis not present

## 2015-05-09 DIAGNOSIS — A630 Anogenital (venereal) warts: Secondary | ICD-10-CM | POA: Diagnosis not present

## 2015-05-09 DIAGNOSIS — D271 Benign neoplasm of left ovary: Secondary | ICD-10-CM | POA: Diagnosis not present

## 2015-05-09 DIAGNOSIS — N920 Excessive and frequent menstruation with regular cycle: Secondary | ICD-10-CM | POA: Diagnosis not present

## 2015-05-09 HISTORY — PX: SALPINGOOPHORECTOMY: SHX82

## 2015-05-09 HISTORY — PX: LAPAROSCOPY: SHX197

## 2015-05-09 LAB — TYPE AND SCREEN
ABO/RH(D): O POS
ANTIBODY SCREEN: NEGATIVE

## 2015-05-09 LAB — PREGNANCY, URINE: Preg Test, Ur: NEGATIVE

## 2015-05-09 LAB — ABO/RH: ABO/RH(D): O POS

## 2015-05-09 SURGERY — LAPAROSCOPY, DIAGNOSTIC
Anesthesia: General | Site: Abdomen

## 2015-05-09 MED ORDER — DOCUSATE SODIUM 100 MG PO CAPS: 100.0000 mg | ORAL_CAPSULE | Freq: Two times a day (BID) | ORAL | Status: DC

## 2015-05-09 MED ORDER — ROCURONIUM BROMIDE 100 MG/10ML IV SOLN
INTRAVENOUS | Status: AC
  Filled 2015-05-09: qty 1

## 2015-05-09 MED ORDER — FENTANYL CITRATE (PF) 250 MCG/5ML IJ SOLN
INTRAMUSCULAR | Status: AC
  Filled 2015-05-09: qty 25

## 2015-05-09 MED ORDER — DEXAMETHASONE SODIUM PHOSPHATE 10 MG/ML IJ SOLN
INTRAMUSCULAR | Status: AC
Start: 2015-05-09 — End: 2015-05-09
  Filled 2015-05-09: qty 1

## 2015-05-09 MED ORDER — KETOROLAC TROMETHAMINE 30 MG/ML IJ SOLN
INTRAMUSCULAR | Status: AC
  Filled 2015-05-09: qty 1

## 2015-05-09 MED ORDER — BUPIVACAINE HCL (PF) 0.25 % IJ SOLN
INTRAMUSCULAR | Status: DC | PRN
  Administered 2015-05-09: 30 mL

## 2015-05-09 MED ORDER — ROCURONIUM BROMIDE 100 MG/10ML IV SOLN
INTRAVENOUS | Status: DC | PRN
  Administered 2015-05-09: 25 mg via INTRAVENOUS
  Administered 2015-05-09 (×2): 5 mg via INTRAVENOUS

## 2015-05-09 MED ORDER — LACTATED RINGERS IR SOLN
Status: DC | PRN
  Administered 2015-05-09: 3000 mL

## 2015-05-09 MED ORDER — PROPOFOL 10 MG/ML IV BOLUS
INTRAVENOUS | Status: DC | PRN
  Administered 2015-05-09: 200 mg via INTRAVENOUS

## 2015-05-09 MED ORDER — ACETAMINOPHEN 10 MG/ML IV SOLN
1000.0000 mg | Freq: Once | INTRAVENOUS | Status: AC
  Administered 2015-05-09: 1000 mg via INTRAVENOUS
  Filled 2015-05-09: qty 100

## 2015-05-09 MED ORDER — HYDROMORPHONE HCL 1 MG/ML IJ SOLN
0.5000 mg | INTRAMUSCULAR | Status: DC | PRN
  Administered 2015-05-09 (×2): 0.5 mg via INTRAVENOUS

## 2015-05-09 MED ORDER — LACTATED RINGERS IV SOLN
INTRAVENOUS | Status: DC
  Administered 2015-05-09 (×2): via INTRAVENOUS

## 2015-05-09 MED ORDER — LACTATED RINGERS IV SOLN
INTRAVENOUS | Status: DC
  Administered 2015-05-09: 19:00:00 via INTRAVENOUS

## 2015-05-09 MED ORDER — DEXAMETHASONE SODIUM PHOSPHATE 4 MG/ML IJ SOLN
INTRAMUSCULAR | Status: DC | PRN
  Administered 2015-05-09: 4 mg via INTRAVENOUS

## 2015-05-09 MED ORDER — FENTANYL CITRATE (PF) 100 MCG/2ML IJ SOLN
INTRAMUSCULAR | Status: AC
  Filled 2015-05-09: qty 2

## 2015-05-09 MED ORDER — FENTANYL CITRATE (PF) 100 MCG/2ML IJ SOLN
25.0000 ug | INTRAMUSCULAR | Status: DC | PRN
  Administered 2015-05-09 (×3): 50 ug via INTRAVENOUS

## 2015-05-09 MED ORDER — ONDANSETRON HCL 4 MG/2ML IJ SOLN
INTRAMUSCULAR | Status: AC
  Filled 2015-05-09: qty 2

## 2015-05-09 MED ORDER — SCOPOLAMINE 1 MG/3DAYS TD PT72
1.0000 | MEDICATED_PATCH | Freq: Once | TRANSDERMAL | Status: DC
  Administered 2015-05-09: 1.5 mg via TRANSDERMAL

## 2015-05-09 MED ORDER — NEOSTIGMINE METHYLSULFATE 10 MG/10ML IV SOLN
INTRAVENOUS | Status: AC
  Filled 2015-05-09: qty 1

## 2015-05-09 MED ORDER — MIDAZOLAM HCL 2 MG/2ML IJ SOLN
INTRAMUSCULAR | Status: AC
  Filled 2015-05-09: qty 4

## 2015-05-09 MED ORDER — GLYCOPYRROLATE 0.2 MG/ML IJ SOLN
INTRAMUSCULAR | Status: AC
  Filled 2015-05-09: qty 2

## 2015-05-09 MED ORDER — PROPOFOL 10 MG/ML IV BOLUS
INTRAVENOUS | Status: AC
  Filled 2015-05-09: qty 20

## 2015-05-09 MED ORDER — OXYCODONE HCL 5 MG/5ML PO SOLN: 5.0000 mg | Freq: Once | ORAL | Status: DC | PRN

## 2015-05-09 MED ORDER — HYDROMORPHONE HCL 1 MG/ML IJ SOLN
INTRAMUSCULAR | Status: AC
  Filled 2015-05-09: qty 1

## 2015-05-09 MED ORDER — KETOROLAC TROMETHAMINE 30 MG/ML IJ SOLN
INTRAMUSCULAR | Status: DC | PRN
  Administered 2015-05-09: 30 mg via INTRAVENOUS

## 2015-05-09 MED ORDER — MIDAZOLAM HCL 2 MG/2ML IJ SOLN
INTRAMUSCULAR | Status: DC | PRN
  Administered 2015-05-09: 2 mg via INTRAVENOUS

## 2015-05-09 MED ORDER — SCOPOLAMINE 1 MG/3DAYS TD PT72
MEDICATED_PATCH | TRANSDERMAL | Status: DC
Start: 2015-05-09 — End: 2015-05-09
  Filled 2015-05-09: qty 1

## 2015-05-09 MED ORDER — PROMETHAZINE HCL 25 MG/ML IJ SOLN: 6.2500 mg | INTRAMUSCULAR | Status: DC | PRN

## 2015-05-09 MED ORDER — GLYCOPYRROLATE 0.2 MG/ML IJ SOLN
INTRAMUSCULAR | Status: DC | PRN
  Administered 2015-05-09: 0.1 mg via INTRAVENOUS
  Administered 2015-05-09: 0.6 mg via INTRAVENOUS

## 2015-05-09 MED ORDER — LIDOCAINE HCL (CARDIAC) 20 MG/ML IV SOLN
INTRAVENOUS | Status: AC
  Filled 2015-05-09: qty 5

## 2015-05-09 MED ORDER — LIDOCAINE HCL (CARDIAC) 20 MG/ML IV SOLN
INTRAVENOUS | Status: DC | PRN
  Administered 2015-05-09: 80 mg via INTRAVENOUS

## 2015-05-09 MED ORDER — SODIUM CHLORIDE 0.9 % IJ SOLN
INTRAMUSCULAR | Status: DC | PRN
  Administered 2015-05-09: 10 mL

## 2015-05-09 MED ORDER — OXYCODONE-ACETAMINOPHEN 5-325 MG PO TABS: 1.0000 | ORAL_TABLET | Freq: Four times a day (QID) | ORAL | Status: DC | PRN

## 2015-05-09 MED ORDER — ONDANSETRON HCL 4 MG/2ML IJ SOLN
INTRAMUSCULAR | Status: DC | PRN
  Administered 2015-05-09: 4 mg via INTRAVENOUS

## 2015-05-09 MED ORDER — NEOSTIGMINE METHYLSULFATE 10 MG/10ML IV SOLN
INTRAVENOUS | Status: DC | PRN
  Administered 2015-05-09: 3 mg via INTRAVENOUS

## 2015-05-09 MED ORDER — DEXAMETHASONE SODIUM PHOSPHATE 4 MG/ML IJ SOLN
INTRAMUSCULAR | Status: AC
  Filled 2015-05-09: qty 1

## 2015-05-09 MED ORDER — SODIUM CHLORIDE 0.9 % IJ SOLN
INTRAMUSCULAR | Status: AC
  Filled 2015-05-09: qty 10

## 2015-05-09 MED ORDER — BUPIVACAINE HCL (PF) 0.25 % IJ SOLN
INTRAMUSCULAR | Status: AC
  Filled 2015-05-09: qty 30

## 2015-05-09 MED ORDER — OXYCODONE HCL 5 MG PO TABS: 5.0000 mg | ORAL_TABLET | Freq: Once | ORAL | Status: DC | PRN

## 2015-05-09 MED ORDER — FENTANYL CITRATE (PF) 250 MCG/5ML IJ SOLN
INTRAMUSCULAR | Status: DC | PRN
  Administered 2015-05-09: 100 ug via INTRAVENOUS
  Administered 2015-05-09 (×3): 50 ug via INTRAVENOUS

## 2015-05-09 MED ORDER — IBUPROFEN 600 MG PO TABS
600.0000 mg | ORAL_TABLET | Freq: Four times a day (QID) | ORAL | Status: DC | PRN
Start: 2015-05-09 — End: 2019-12-20

## 2015-05-09 SURGICAL SUPPLY — 32 items
CLOTH BEACON ORANGE TIMEOUT ST (SAFETY) ×3 IMPLANT
DEVICE TROCAR PUNCTURE CLOSURE (ENDOMECHANICALS) IMPLANT
DRSG COVADERM PLUS 2X2 (GAUZE/BANDAGES/DRESSINGS) ×6 IMPLANT
DRSG OPSITE POSTOP 3X4 (GAUZE/BANDAGES/DRESSINGS) ×6 IMPLANT
DURAPREP 26ML APPLICATOR (WOUND CARE) ×3 IMPLANT
FORCEPS CUTTING 33CM 5MM (CUTTING FORCEPS) IMPLANT
GLOVE BIOGEL PI IND STRL 6.5 (GLOVE) ×4 IMPLANT
GLOVE BIOGEL PI INDICATOR 6.5 (GLOVE) ×2
GLOVE ECLIPSE 6.5 STRL STRAW (GLOVE) ×3 IMPLANT
GOWN STRL REUS W/TWL LRG LVL3 (GOWN DISPOSABLE) ×12 IMPLANT
LIQUID BAND (GAUZE/BANDAGES/DRESSINGS) ×3 IMPLANT
MANIPULATOR UTERINE 4.5 ZUMI (MISCELLANEOUS) ×3 IMPLANT
NEEDLE INSUFFLATION 120MM (ENDOMECHANICALS) ×3 IMPLANT
PACK LAPAROSCOPY BASIN (CUSTOM PROCEDURE TRAY) ×3 IMPLANT
PAD POSITIONING PINK XL (MISCELLANEOUS) ×3 IMPLANT
POUCH SPECIMEN RETRIEVAL 10MM (ENDOMECHANICALS) ×3 IMPLANT
SET IRRIG TUBING LAPAROSCOPIC (IRRIGATION / IRRIGATOR) ×3 IMPLANT
SHEARS HARMONIC ACE PLUS 36CM (ENDOMECHANICALS) ×3 IMPLANT
SLEEVE XCEL OPT CAN 5 100 (ENDOMECHANICALS) IMPLANT
STRIP CLOSURE SKIN 1/4X4 (GAUZE/BANDAGES/DRESSINGS) IMPLANT
SUT MON AB 4-0 PS1 27 (SUTURE) ×3 IMPLANT
SUT VIC AB 0 CT1 27 (SUTURE)
SUT VIC AB 0 CT1 27XBRD ANBCTR (SUTURE) IMPLANT
SUT VICRYL 0 UR6 27IN ABS (SUTURE) ×3 IMPLANT
TOWEL OR 17X24 6PK STRL BLUE (TOWEL DISPOSABLE) ×6 IMPLANT
TRAY FOLEY CATH SILVER 14FR (SET/KITS/TRAYS/PACK) ×3 IMPLANT
TROCAR XCEL NON-BLD 11X100MML (ENDOMECHANICALS) ×3 IMPLANT
TROCAR XCEL NON-BLD 5MMX100MML (ENDOMECHANICALS) ×6 IMPLANT
TUBING CONNECTOR 18X5MM (MISCELLANEOUS) ×3 IMPLANT
WARMER LAPAROSCOPE (MISCELLANEOUS) ×3 IMPLANT
WATER STERILE IRR 1000ML POUR (IV SOLUTION) IMPLANT
YANKAUER SUCT BULB TIP NO VENT (SUCTIONS) ×3 IMPLANT

## 2015-05-09 NOTE — Op Note (Signed)
PREOPERATIVE DIAGNOSIS:  Left dermoid cyst POSTOPERATIVE DIAGNOSIS: same PROCEDURE PERFORMED: Operative laparoscopy, left salpingo-oophorectomy SURGEON: Dr. Janyth Pupa ASSISTANT:Jim Jeanella Cara, RN ANESTHESIA: General endotracheal.  ESTIMATED BLOOD LOSS: 10cc.  URINE OUTPUT: 100cc of clear yellow urine at the end of the procedure.  IV FLUIDS: 1500cc of crystalloid.  SPECIMEN(S): 1) Left fallopian tube and ovary with dermoid cyst COMPLICATIONS: None.  CONDITION: Stable.  FINDINGS: No ascites or peritoneal studding was appreciated.  Liver, gallbladder and bowel appeared grossly normal.  Uterus normal size and shape.  Right ovary and bilateral tubes unremarkable.  Left ovary enlarged ~ 7cm- no normal ovarian tissue identified.  Cyst findings suggestive of dermoid.  Informed consent was obtained from the patient prior to taking her to the operating room where anesthesia was found to be adequate. She was placed in dorsal lithotomy position and examined under anesthesia. She was prepped and draped in normal sterile fashion. The bladder was catheterized with a foley under sterile technique.  A bi-valve speculum was then placed and the anterior lip of the cervix was grasped with the single tooth tenaculum. A Hulka uterine manipulator was then advanced into the uterus to provide uterine mobility. The speculum and tenaculum were then removed.  Attention was turned to the patient's abdomen where 10cc of Marcaine was injected.  A 10 mm infraumbilical skin incision was made with the scalpel. The veress needle was carefully introduced into the peritoneal cavity while tenting the abdominal wall. Intraperitoneal placement was confirmed by use of a saline-drop test.  The gas was connected and confirmed intrabdominal placement by a low initial pressure of 10mmHg. The abdomen was then insuflated with CO2 gas. The trocar and sleeve were advanced without difficulty into the abdomen under direct visualization. Intraabdominal  placement was confirmed by the laparoscope and surveillance of the abdomen was performed. Grossly normal appearing abdomen with the findings as mentioned above.Two additional 48mm skin incision were made in the left and right lower quadrants with placement of the trocar under direct visualization.  Each area was first injected with Marcaine and the trocar advanced under direct visualization.  Both ovaries were examined, right ovary noted to be normal.  The left ureter was identified.  The left ovary was then placed on tension and using the Harmonic, the left infundipulopelvic ligament was clamped and ligated. Serial ligations were used for full dissection of the left ovary and fallopian tube. The endocatch bag was then placed through the 64mm port and the specimen was removed in its entirety.  Due to the large size of the cyst, the cyst was removed in pieces within the bag- contents confirmed dermoid cyst.  Re-examination of the uterus and abdominal cavity confirmed hemostasis.  The pelvis was irrigated and again excellent hemostasis was noted.  All trocars were removed allowing air to fully escape.  The fascia was closed using 0-vicryl. The umbilical port site was closed with monocryl. The lower quadrants were closed with dermabond.  The manipulator and foley catheter was then removed.  Hemostasis at the tenaculum site was achieved with silver Nitrazine and hemostasis was obtained.  The patient tolerated the procedure well with all sponge, lap, and needle counts correct. The patient was taken to recovery in stable condition.   Janyth Pupa, DO [phone] (pager) [phone] (office)

## 2015-05-09 NOTE — Anesthesia Postprocedure Evaluation (Signed)
  Anesthesia Post-op Note  Patient: Courtney [name]  Procedure(s) Performed: Procedure(s) (LRB): LAPAROSCOPY DIAGNOSTIC (N/A) SALPINGO OOPHORECTOMY (Left)  Patient Location: PACU  Anesthesia Type: General  Level of Consciousness: awake and alert   Airway and Oxygen Therapy: Patient Spontanous Breathing  Post-op Pain: mild  Post-op Assessment: Post-op Vital signs reviewed, Patient's Cardiovascular Status Stable, Respiratory Function Stable, Patent Airway and No signs of Nausea or vomiting  Last Vitals:  Filed Vitals:   05/09/15 1800  BP: 108/72  Pulse: 66  Temp:   Resp: 12    Post-op Vital Signs: stable   Complications: No apparent anesthesia complications

## 2015-05-09 NOTE — Discharge Instructions (Signed)
HOME INSTRUCTIONS  Please note any unusual or excessive bleeding, pain, swelling. Mild dizziness or drowsiness are normal for about 24 hours after surgery.   Shower when comfortable  Restrictions: No driving for 24 hours or while taking pain medications.  Activity:  No heavy lifting (> 10 lbs), nothing in vagina (no tampons, douching, or intercourse) x 4 weeks; no tub baths for 4 weeks Vaginal spotting is expected but if your bleeding is heavy, period like,  please call the office   Incision: the bandaids will fall off when they are ready to; you may clean your incision with mild soap and water but do not rub or scrub the incision site.  You may experience slight bloody drainage from your incision periodically.  This is normal.  If you experience a large amount of drainage or the incision opens, please call your physician who will likely direct you to the emergency department.  Diet:  You may eat whatever you want.  Do not eat large meals.  Eat small frequent meals throughout the day.  Continue to drink a good amount of water at least 6-8 glasses of water per day, hydration is very important for the healing process.  Pain Management: Take Motrin and/or Percocet as prescribed/needed for pain.  Percocet may cause constipation- if you take this medication please use Colace (stool softener) twice daily as needed.  You may also use any over the counter laxative if needed.  Always take prescription pain medication with food, it may cause constipation, increase fluids and fiber and you may want to take an over-the-counter stool softener like Colace as needed up to 2x a day.    Alcohol -- Avoid for 24 hours and while taking pain medications.  Nausea: Take sips of ginger ale or soda  Fever -- Call physician if temperature over 101 degrees  Follow up:  If you do not already have a follow up appointment scheduled, please call the office at [phone].  If you experience fever (a temperature greater  than 100.4), pain unrelieved by pain medication, shortness of breath, swelling of a single leg, or any other symptoms which are concerning to you please the office immediately.

## 2015-05-09 NOTE — Addendum Note (Signed)
Addendum  created 05/09/15 1844 by Jillyn Hidden, MD   Modules edited: Orders, PRL Based Order Sets

## 2015-05-09 NOTE — Interval H&P Note (Signed)
History and Physical Interval Note:  05/09/2015 3:11 PM  Courtney [name]  has presented today for surgery, with the diagnosis of DERMOID CYST   The various methods of treatment have been discussed with the patient and family. After consideration of risks, benefits and other options for treatment, the patient has consented to  Procedure(s) with comments: LAPAROSCOPY DIAGNOSTIC (N/A) - [name], [place] (Left) as a surgical intervention .  The patient's history has been reviewed, patient examined, no change in status, stable for surgery.  I have reviewed the patient's chart and labs.  Questions were answered to the patient's satisfaction.     Janyth Pupa, M

## 2015-05-09 NOTE — Anesthesia Preprocedure Evaluation (Addendum)
Anesthesia Evaluation  Patient identified by MRN, date of birth, ID band Patient awake    Reviewed: Allergy & Precautions, H&P , NPO status , Patient's Chart, lab work & pertinent test results  History of Anesthesia Complications Negative for: history of anesthetic complications  Airway Mallampati: I  TM Distance: >3 FB Neck ROM: full    Dental no notable dental hx.    Pulmonary neg pulmonary ROS,  breath sounds clear to auscultation  Pulmonary exam normal       Cardiovascular negative cardio ROS Normal cardiovascular examRhythm:regular Rate:Normal     Neuro/Psych negative neurological ROS     GI/Hepatic negative GI ROS, Neg liver ROS,   Endo/Other  negative endocrine ROS  Renal/GU negative Renal ROS     Musculoskeletal   Abdominal   Peds  Hematology  (+) anemia ,   Anesthesia Other Findings NPO appropriate, allergies reviewed Denies active cardiac or pulmonary symptoms, METS > 4 No recent congestive cough or symptoms of upper respiratory infection    Reproductive/Obstetrics negative OB ROS                            Anesthesia Physical Anesthesia Plan  ASA: I  Anesthesia Plan: General   Post-op Pain Management:    Induction: Intravenous  Airway Management Planned: Oral ETT  Additional Equipment:   Intra-op Plan:   Post-operative Plan: Extubation in OR  Informed Consent: I have reviewed the patients History and Physical, chart, labs and discussed the procedure including the risks, benefits and alternatives for the proposed anesthesia with the patient or authorized representative who has indicated his/her understanding and acceptance.     Plan Discussed with: Anesthesiologist, CRNA and Surgeon  Anesthesia Plan Comments:         Anesthesia Quick Evaluation

## 2015-05-09 NOTE — Transfer of Care (Signed)
Immediate Anesthesia Transfer of Care Note  Patient: Courtney [name]  Procedure(s) Performed: Procedure(s) with comments: LAPAROSCOPY DIAGNOSTIC (N/A) - [name], SRNFA confirmed-08-24-kah SALPINGO OOPHORECTOMY (Left)  Patient Location: PACU  Anesthesia Type:General  Level of Consciousness: awake, alert  and oriented  Airway & Oxygen Therapy: Patient Spontanous Breathing and Patient connected to nasal cannula oxygen  Post-op Assessment: Report given to RN and Post -op Vital signs reviewed and stable  Post vital signs: Reviewed and stable  Last Vitals:  Filed Vitals:   05/09/15 1502  BP: 104/84  Pulse: 75  Temp: 36.8 C  Resp: 18    Complications: No apparent anesthesia complications

## 2015-05-09 NOTE — Anesthesia Procedure Notes (Signed)
Procedure Name: Intubation Date/Time: 05/09/2015 4:02 PM Performed by: Flossie Dibble Pre-anesthesia Checklist: Patient identified, Timeout performed, Emergency Drugs available, Suction available and Patient being monitored Patient Re-evaluated:Patient Re-evaluated prior to inductionOxygen Delivery Method: Circle system utilized Preoxygenation: Pre-oxygenation with 100% oxygen Intubation Type: IV induction Ventilation: Mask ventilation without difficulty Laryngoscope Size: Mac and 3 Grade View: Grade I Tube type: Oral Number of attempts: 1 Airway Equipment and Method: Stylet Placement Confirmation: ETT inserted through vocal cords under direct vision,  breath sounds checked- equal and bilateral and positive ETCO2 Secured at: 20 cm Tube secured with: Tape Dental Injury: Teeth and Oropharynx as per pre-operative assessment

## 2015-05-10 ENCOUNTER — Encounter (HOSPITAL_COMMUNITY): Payer: Self-pay | Admitting: Obstetrics & Gynecology

## 2015-05-25 ENCOUNTER — Telehealth: Payer: Self-pay | Admitting: Obstetrics and Gynecology

## 2015-05-25 NOTE — Telephone Encounter (Signed)
Telephone call from patient requesting nausea medication.   Pt s/p Operative laparoscopy, left salpingo-oophorectomy on 05/12/15 with Dr. Nelda Marseille.  Has been tolerating food and fluids, nausea began last night. Denies fever, dysuria, and vaginal bleeding or vaginal odor.  Incision is WNL per pt. Had BM yesterday. No vomiting.   Rx Zofran ODT per pt request to [facility].  Pt advised to call back with any worsening of symptoms.  Donnel Saxon, CNM 05/25/15 1534

## 2015-05-26 ENCOUNTER — Telehealth (HOSPITAL_COMMUNITY): Payer: Self-pay | Admitting: Certified Nurse Midwife

## 2016-02-12 ENCOUNTER — Ambulatory Visit
Admission: RE | Admit: 2016-02-12 | Discharge: 2016-02-12 | Disposition: A | Discharge status: Home / Self Care | Payer: Medicaid Other | Source: Ambulatory Visit | Attending: Internal Medicine | Admitting: Internal Medicine

## 2016-02-12 ENCOUNTER — Other Ambulatory Visit: Payer: Self-pay | Admitting: Internal Medicine

## 2016-02-12 DIAGNOSIS — R0602 Shortness of breath: Secondary | ICD-10-CM

## 2016-03-25 ENCOUNTER — Emergency Department (HOSPITAL_COMMUNITY)
Admission: EM | Admit: 2016-03-25 | Discharge: 2016-03-25 | Disposition: A | Discharge status: Home / Self Care | Payer: Medicaid Other | Attending: Emergency Medicine | Admitting: Emergency Medicine

## 2016-03-25 ENCOUNTER — Emergency Department (HOSPITAL_COMMUNITY): Payer: Medicaid Other

## 2016-03-25 ENCOUNTER — Encounter (HOSPITAL_COMMUNITY): Payer: Self-pay

## 2016-03-25 DIAGNOSIS — Z7721 Contact with and (suspected) exposure to potentially hazardous body fluids: Secondary | ICD-10-CM | POA: Diagnosis present

## 2016-03-25 DIAGNOSIS — R102 Pelvic and perineal pain: Secondary | ICD-10-CM

## 2016-03-25 DIAGNOSIS — Z206 Contact with and (suspected) exposure to human immunodeficiency virus [HIV]: Secondary | ICD-10-CM | POA: Insufficient documentation

## 2016-03-25 DIAGNOSIS — B9689 Other specified bacterial agents as the cause of diseases classified elsewhere: Secondary | ICD-10-CM | POA: Insufficient documentation

## 2016-03-25 DIAGNOSIS — Z79899 Other long term (current) drug therapy: Secondary | ICD-10-CM | POA: Diagnosis not present

## 2016-03-25 DIAGNOSIS — N760 Acute vaginitis: Secondary | ICD-10-CM | POA: Diagnosis not present

## 2016-03-25 LAB — WET PREP, GENITAL
SPERM: NONE SEEN
TRICH WET PREP: NONE SEEN
YEAST WET PREP: NONE SEEN

## 2016-03-25 LAB — URINALYSIS, ROUTINE W REFLEX MICROSCOPIC
Bilirubin Urine: NEGATIVE
GLUCOSE, UA: NEGATIVE mg/dL
HGB URINE DIPSTICK: NEGATIVE
KETONES UR: NEGATIVE mg/dL
LEUKOCYTES UA: NEGATIVE
Nitrite: NEGATIVE
PROTEIN: NEGATIVE mg/dL
Specific Gravity, Urine: 1.02 (ref 1.005–1.030)
pH: 8 (ref 5.0–8.0)

## 2016-03-25 MED ORDER — ELVITEG-COBIC-EMTRICIT-TENOFAF 150-150-200-10 MG PO TABS: 1.0000 | ORAL_TABLET | Freq: Every day | ORAL | Status: DC

## 2016-03-25 MED ORDER — CEFTRIAXONE SODIUM 250 MG IJ SOLR
250.0000 mg | Freq: Once | INTRAMUSCULAR | Status: AC
  Administered 2016-03-25: 250 mg via INTRAMUSCULAR
  Filled 2016-03-25: qty 250

## 2016-03-25 MED ORDER — AZITHROMYCIN 250 MG PO TABS
1000.0000 mg | ORAL_TABLET | Freq: Once | ORAL | Status: AC
  Administered 2016-03-25: 1000 mg via ORAL
  Filled 2016-03-25: qty 4

## 2016-03-25 MED ORDER — STERILE WATER FOR INJECTION IJ SOLN
INTRAMUSCULAR | Status: AC
  Administered 2016-03-25: 10 mL
  Filled 2016-03-25: qty 10

## 2016-03-25 MED ORDER — ELVITEG-COBIC-EMTRICIT-TENOFAF 150-150-200-10 MG PO TABS
1.0000 | ORAL_TABLET | Freq: Once | ORAL | Status: AC
  Administered 2016-03-25: 1 via ORAL
  Filled 2016-03-25: qty 1

## 2016-03-25 MED ORDER — METRONIDAZOLE 500 MG PO TABS: 500.0000 mg | ORAL_TABLET | Freq: Two times a day (BID) | ORAL | Status: DC
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
## 2016-03-25 NOTE — ED Notes (Signed)
Patient able to ambulate independently  
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
## 2016-03-25 NOTE — Discharge Instructions (Signed)
Please read and follow all provided instructions.  Your diagnoses today include:  1. Contact with and (suspected) exposure to human immunodeficiency virus (hiv)   2. Pelvic pain in female   3. Bacterial vaginosis    Tests performed today include:  Test for gonorrhea and chlamydia. You will be notified by telephone if you have a positive result.  Vital signs. See below for your results today.   Medications:  You were treated for chlamydia (1 gram azithromycin pills) and gonorrhea (250mg  rocephin shot).  Home care instructions:  Read educational materials contained in this packet and follow any instructions provided.   You should tell your partners about your infection and avoid having sex for one week to allow time for the medicine to work.  Follow-up instructions: You should follow-up with the [facility]D clinic to be tested for HIV, syphilis, and hepatitis -- all of which can be transmitted by sexual contact. We do not routinely screen for these in the Emergency Department.  STD Testing:  [place] [place] [facility]  [address], [place], phone [phone] or [phone]    Monday - Friday, call for an appointment  [place] [place] [facility]  [address], [place], phone [phone] or [phone]   Monday - Friday, call for an appointment  Return instructions:   Please return to the Emergency Department if you experience worsening symptoms.   Please return if you have any other emergent concerns.  Additional Information:  Your vital signs today were: BP 136/93 mmHg   Pulse 81   Temp(Src) 99.2 F (37.3 C) (Oral)   Resp 18   SpO2 100% If your blood pressure (BP) was elevated above 135/85 this visit, please have this repeated by your doctor within one month. --------------

## 2016-03-25 NOTE — ED Notes (Signed)
Wanda, Case Manager at bedside  

## 2016-03-25 NOTE — Care Management (Signed)
ED CM was consulted on patient to assist Courtney nPEP HIV exposure. CM reviewed record. Patient has Medicaid Courtney [name] PCP  Courtney [name]. CM met Courtney patient at bedside confirmed information. Discussed that patient will need to complete  28 day regimen of Genvoya, which will be covered by Medicaid, patient verbalized understanding. Explained that medication is available at [facility] on [address], CM offered to fax to pharmacy, patient is agreeable. Prescription faxed to [facility] 336 [phone], received fax confirmation. Patient instructed upon discharge she can pick the medication up pharmacy is open 24 hrs. Also discussed that patient will need to have titer rechecked in 4-6 patient verbalizes understanding teach back done. No further questions or concerns voiced. No additional CM needs identified.

## 2016-03-26 LAB — HIV ANTIBODY (ROUTINE TESTING W REFLEX): HIV Screen 4th Generation wRfx: NONREACTIVE

## 2016-03-26 LAB — GC/CHLAMYDIA PROBE AMP (~~LOC~~) NOT AT ARMC
Chlamydia: NEGATIVE
Neisseria Gonorrhea: NEGATIVE

## 2016-04-02 ENCOUNTER — Ambulatory Visit
Admission: RE | Admit: 2016-04-02 | Discharge: 2016-04-02 | Disposition: A | Discharge status: Home / Self Care | Payer: Medicaid Other | Source: Ambulatory Visit | Attending: Internal Medicine | Admitting: Internal Medicine

## 2016-04-02 ENCOUNTER — Other Ambulatory Visit: Payer: Self-pay | Admitting: Internal Medicine

## 2016-04-02 ENCOUNTER — Other Ambulatory Visit (HOSPITAL_COMMUNITY): Payer: Self-pay | Admitting: Internal Medicine

## 2016-04-02 DIAGNOSIS — M79605 Pain in left leg: Secondary | ICD-10-CM

## 2016-04-02 DIAGNOSIS — M7989 Other specified soft tissue disorders: Secondary | ICD-10-CM

## 2016-06-14 ENCOUNTER — Emergency Department (HOSPITAL_COMMUNITY)
Admission: EM | Admit: 2016-06-14 | Discharge: 2016-06-14 | Disposition: A | Discharge status: Home / Self Care | Payer: Medicaid Other | Attending: Emergency Medicine | Admitting: Emergency Medicine

## 2016-06-14 ENCOUNTER — Encounter (HOSPITAL_COMMUNITY): Payer: Self-pay | Admitting: Emergency Medicine

## 2016-06-14 DIAGNOSIS — H6122 Impacted cerumen, left ear: Secondary | ICD-10-CM | POA: Insufficient documentation

## 2016-06-14 DIAGNOSIS — Y929 Unspecified place or not applicable: Secondary | ICD-10-CM | POA: Insufficient documentation

## 2016-06-14 DIAGNOSIS — H9201 Otalgia, right ear: Secondary | ICD-10-CM | POA: Insufficient documentation

## 2016-06-14 DIAGNOSIS — Y999 Unspecified external cause status: Secondary | ICD-10-CM | POA: Diagnosis not present

## 2016-06-14 DIAGNOSIS — Y939 Activity, unspecified: Secondary | ICD-10-CM | POA: Insufficient documentation
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
## 2016-08-04 ENCOUNTER — Other Ambulatory Visit: Payer: Self-pay | Admitting: Obstetrics & Gynecology

## 2016-10-26 ENCOUNTER — Ambulatory Visit: Payer: Medicaid Other | Attending: Gynecologic Oncology | Admitting: Gynecologic Oncology

## 2016-10-26 ENCOUNTER — Encounter: Payer: Self-pay | Admitting: Gynecologic Oncology

## 2016-10-26 DIAGNOSIS — N890 Mild vaginal dysplasia: Secondary | ICD-10-CM

## 2016-10-26 DIAGNOSIS — Z79899 Other long term (current) drug therapy: Secondary | ICD-10-CM | POA: Diagnosis not present

## 2016-10-26 DIAGNOSIS — R87821 Vaginal low risk human papillomavirus (HPV) DNA test positive: Secondary | ICD-10-CM

## 2016-10-26 NOTE — Progress Notes (Signed)
Consult Note: Gyn-Onc  Consult was requested by Dr. Nelda Marseille for the evaluation of Courtney [name] 36 y.o. female  CC:  Chief Complaint  Patient presents with  . VAIN I    Assessment/Plan:  Courtney [name]  is a 36 y.o.  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  There is no grossly visible lesion at present on routine exam.  The patient arrived 40 minutes late for her appointment therefore we were unable to evaluate her with colposcopy today.  We rescheduled for an alternate appointment time for colposcopic evaluation of the vulva and vagina. If unremarkable, recommend pap in April, 2018 with obligate HPV testing.  I spent time counseling Courtney [name] about the nature of HPV infection and dysplasia.  I discussed that low grade dysplasia such as VAIN I is not cancer and is associated with a 5% risk of progression to VAIN III. VAIN III is associated with a 30% risk of progression to malignancy. Therefore, for the majority of these lesions, malignancy does not result. Addtiionally we discussed that HPV infection cannot be treated, but is instead monitored as it predicts a higher risk for development of dysplasia. We discussed the sexually acquired nature of HPV infection, but that most who are exposed do not develop chronic carrier status or dysplasia.  I discussed that no further treatment interventions (eg surgery or topical creams) are indicated or necessary for VAIN I. She does not require long term evaluation with gynecologic oncology for this.   After colposcopic evaluation, provided no high grade vaginal lesions are identified, she can return to Dr Nelda Marseille for definitive management of future pap surveillance in accordance with ASCCP guidelines.   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  The patient has a remote history of HPV infection at age 62. She reports feeling an itchy  "bump" on her vagina that she informed Dr Nelda Marseille about in November, 2017. Dr Nelda Marseille identifed a 32mm wart like lesion in the vagina (unclear of specific location) which she removed at that time (08/04/16). Pathology revealed VAIN I.  Since that time the patient has not appreciated symptoms of pruritis or vaginal bleeding.  She cannot specifically remember the date of her last pap smear and these results are not available to [place]. She does know that she is due for pap surveillance in April, 2018. She does not known if HPV co-testing has been performed.  Current Meds:  Outpatient Encounter Prescriptions as of 10/26/2016  Medication Sig  . ibuprofen (ADVIL,MOTRIN) 600 MG tablet Take 1 tablet (600 mg total) by mouth every 6 (six) hours as needed.  . methocarbamol (ROBAXIN) 500 MG tablet Take 2 tablets (1,000 mg total) by mouth every 8 (eight) hours as needed for muscle spasms.  [name] NUVARING 0.12-0.015 MG/24HR vaginal ring Place 1 each vaginally every 28 (twenty-eight) days.   . ondansetron (ZOFRAN) 8 MG tablet Take 1 tablet (8 mg total) by mouth every 8 (eight) hours as needed for nausea or vomiting.  [name] oxyCODONE-acetaminophen (ROXICET) 5-325 MG per tablet Take 1 tablet by mouth every 6 (six) hours as needed for severe pain.  . [DISCONTINUED] docusate sodium (COLACE) 100 MG capsule Take 1 capsule (100 mg total) by mouth 2 (two) times daily. (Patient not taking: Reported on 10/26/2016)  . [DISCONTINUED] doxycycline (VIBRAMYCIN) 100 MG capsule Take 1 capsule (100 mg total) by mouth  2 (two) times daily. One po bid x 14 days (Patient not taking: Reported on 10/26/2016)  . [DISCONTINUED] elvitegravir-cobicistat-emtricitabine-tenofovir (GENVOYA) 150-150-200-10 MG TABS tablet Take 1 tablet by mouth daily. (Patient not taking: Reported on 10/26/2016)  . [DISCONTINUED] elvitegravir-cobicistat-emtricitabine-tenofovir (GENVOYA) 150-150-200-10 MG TABS tablet Take 1 tablet by mouth daily with breakfast. (Patient not taking:  Reported on 10/26/2016)  . [DISCONTINUED] metroNIDAZOLE (FLAGYL) 500 MG tablet Take 1 tablet (500 mg total) by mouth 2 (two) times daily. (Patient not taking: Reported on 10/26/2016)   No facility-administered encounter medications on file as of 10/26/2016.     Allergy:  Allergies  Allergen Reactions  . Bee Venom Anaphylaxis    Social Hx:   Social History   Social History  . Marital status: Single    Spouse name: N/A  . Number of children: N/A  . Years of education: N/A   Occupational History  . Not on file.   Social History Main Topics  . Smoking status: Never Smoker  . Smokeless tobacco: Never Used  . Alcohol use No  . Drug use: No  . Sexual activity: Yes    Birth control/ protection: Injection     Comment: Depo Provera   Other Topics Concern  . Not on file   Social History Narrative  . No narrative on file    Past Surgical Hx:  Past Surgical History:  Procedure Laterality Date  . LAPAROSCOPY N/A 05/09/2015   Procedure: LAPAROSCOPY DIAGNOSTIC;  Surgeon: Janyth Pupa, DO;  Location: [facility];  Service: Gynecology;  Laterality: N/A;  [name], [facility] confirmed-08-24-kah  . NO PAST SURGERIES    . SALPINGOOPHORECTOMY Left 05/09/2015   Procedure: SALPINGO OOPHORECTOMY;  Surgeon: Janyth Pupa, DO;  Location: [facility];  Service: Gynecology;  Laterality: Left;    Past Medical Hx:  Past Medical History:  Diagnosis Date  . Anemia   . Vaginal delivery 2004, 2008    Past Gynecological History:  Oophorectomy for benign lesion.  No LMP recorded.  Family Hx:  Family History  Problem Relation Age of Onset  . Hypertension Mother   . Hyperlipidemia Mother   . Hypertension Father   . Heart murmur Sister   . Cancer Maternal Grandmother   . Cancer Maternal Grandfather   . Stroke Paternal Grandmother   . Cancer Paternal Grandfather     Review of Systems:  Constitutional  Feels well,    ENT Normal appearing ears and nares bilaterally Skin/Breast  No rash, sores,  jaundice, itching, dryness Cardiovascular  No chest pain, shortness of breath, or edema  Pulmonary  No cough or wheeze.  Gastro Intestinal  No nausea, vomitting, or diarrhoea. No bright red blood per rectum, no abdominal pain, change in bowel movement, or constipation.  Genito Urinary  No frequency, urgency, dysuria, history of vaginal pruritis. Musculo Skeletal  No myalgia, arthralgia, joint swelling or pain  Neurologic  No weakness, numbness, change in gait,  Psychology  No depression, anxiety, insomnia.   Vitals:  There were no vitals taken for this visit.  Physical Exam: WD in NAD Neck  Supple NROM, without any enlargements.  Lymph Node Survey No cervical supraclavicular or inguinal adenopathy Psychiatry  Alert and oriented to person, place, and time  Abdomen  Normoactive bowel sounds, abdomen soft, non-tender and thin without evidence of hernia.  Back No CVA tenderness Genito Urinary  Vulva/vagina: Normal external female genitalia.  No lesions. No discharge or bleeding.  Bladder/urethra:  No lesions or masses, well supported bladder  Vagina: normal, no  lesions seen  Cervix: Normal appearing, no lesions.  Uterus:  Small, mobile, no parametrial involvement or nodularity.  Adnexa: no palpable masses. Rectal  deferred Extremities  No bilateral cyanosis, clubbing or edema.   Donaciano Eva, MD  10/26/2016, 10:35 AM

## 2016-11-04 ENCOUNTER — Encounter: Payer: Self-pay | Admitting: Gynecologic Oncology

## 2016-11-04 ENCOUNTER — Ambulatory Visit: Payer: Medicaid Other | Attending: Gynecologic Oncology | Admitting: Gynecologic Oncology

## 2016-11-04 ENCOUNTER — Ambulatory Visit (HOSPITAL_BASED_OUTPATIENT_CLINIC_OR_DEPARTMENT_OTHER): Payer: Medicaid Other | Admitting: Gynecologic Oncology

## 2016-11-04 VITALS — BP 122/81 | HR 68 | Temp 97.9°F | Resp 18 | Wt 167.2 lb

## 2016-11-04 DIAGNOSIS — N890 Mild vaginal dysplasia: Secondary | ICD-10-CM

## 2016-11-04 NOTE — Patient Instructions (Signed)
Continue with yearly exams with your GYN.

## 2016-11-04 NOTE — Progress Notes (Signed)
Consult Note: Gyn-Onc  Consult was requested by Dr. Nelda Marseille for the evaluation of Courtney [name] 36 y.o. female  CC:  Chief Complaint  Patient presents with  . VAIN l    Assessment/Plan:  [name]  is a 36 y.o.  year old with a history of left vaginal introitus VAIN1.  There is no grossly visible lesion at present on coloscopy of vagina and cervix.   If unremarkable, recommend pap in April, 2018 with obligate HPV testing with Dr Nelda Marseille. Colpo and directed biopsies after that time if recurrent dysplasia noted.  I discussed that no further treatment interventions (eg surgery or topical creams) are indicated or necessary for VAIN I. She does not require long term evaluation with gynecologic oncology for this.   After colposcopic evaluation, provided no high grade vaginal lesions are identified, she can return to Dr Nelda Marseille for definitive management of future pap surveillance in accordance with ASCCP guidelines.   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  The patient has a remote history of HPV infection at age 72. She reports feeling an itchy "bump" on her vagina that she informed Dr Nelda Marseille about in November, 2017. Dr Nelda Marseille identifed a 91mm wart like lesion in the vagina (unclear of specific location) which she removed at that time (08/04/16). Pathology revealed VAIN I.  Since that time the patient has not appreciated symptoms of pruritis or vaginal bleeding.  She cannot specifically remember the date of her last pap smear and these results are not available to [place]. She does know that she is due for pap surveillance in April, 2018. She does not known if HPV co-testing has been performed.  Current Meds:  Outpatient Encounter Prescriptions as of 11/04/2016  Medication Sig  . ibuprofen (ADVIL,MOTRIN) 600 MG tablet Take 1 tablet (600 mg total) by mouth every 6 (six) hours as needed.   . methocarbamol (ROBAXIN) 500 MG tablet Take 2 tablets (1,000 mg total) by mouth every 8 (eight) hours as needed for muscle spasms. (Patient not taking: Reported on 11/04/2016)  . ondansetron (ZOFRAN) 8 MG tablet Take 1 tablet (8 mg total) by mouth every 8 (eight) hours as needed for nausea or vomiting. (Patient not taking: Reported on 11/04/2016)  . oxyCODONE-acetaminophen (ROXICET) 5-325 MG per tablet Take 1 tablet by mouth every 6 (six) hours as needed for severe pain. (Patient not taking: Reported on 11/04/2016)  . [DISCONTINUED] NUVARING 0.12-0.015 MG/24HR vaginal ring Place 1 each vaginally every 28 (twenty-eight) days.    No facility-administered encounter medications on file as of 11/04/2016.     Allergy:  Allergies  Allergen Reactions  . Bee Venom Anaphylaxis    Social Hx:   Social History   Social History  . Marital status: Single    Spouse name: N/A  . Number of children: N/A  . Years of education: N/A   Occupational History  . Not on file.   Social History Main Topics  . Smoking status: Never Smoker  . Smokeless tobacco: Never Used  . Alcohol use No  . Drug use: No  . Sexual activity: Yes    Birth control/ protection: Injection     Comment: Depo Provera   Other Topics Concern  . Not on file   Social History Narrative  . No narrative on file    Past Surgical Hx:  Past Surgical History:  Procedure Laterality Date  . LAPAROSCOPY N/A 05/09/2015   Procedure: LAPAROSCOPY DIAGNOSTIC;  Surgeon: Janyth Pupa, DO;  Location: [facility];  Service: Gynecology;  Laterality: N/A;  [name], [facility] confirmed-08-24-kah  . NO PAST SURGERIES    . SALPINGOOPHORECTOMY Left 05/09/2015   Procedure: SALPINGO OOPHORECTOMY;  Surgeon: Janyth Pupa, DO;  Location: [facility];  Service: Gynecology;  Laterality: Left;    Past Medical Hx:  Past Medical History:  Diagnosis Date  . Anemia   . Vaginal delivery 2004, 2008    Past Gynecological History:  Oophorectomy for benign lesion.  No  LMP recorded.  Family Hx:  Family History  Problem Relation Age of Onset  . Hypertension Mother   . Hyperlipidemia Mother   . Hypertension Father   . Heart murmur Sister   . Cancer Maternal Grandmother   . Cancer Maternal Grandfather   . Stroke Paternal Grandmother   . Cancer Paternal Grandfather     Review of Systems:  Constitutional  Feels well,    ENT Normal appearing ears and nares bilaterally Skin/Breast  No rash, sores, jaundice, itching, dryness Cardiovascular  No chest pain, shortness of breath, or edema  Pulmonary  No cough or wheeze.  Gastro Intestinal  No nausea, vomitting, or diarrhoea. No bright red blood per rectum, no abdominal pain, change in bowel movement, or constipation.  Genito Urinary  No frequency, urgency, dysuria, history of vaginal pruritis. Musculo Skeletal  No myalgia, arthralgia, joint swelling or pain  Neurologic  No weakness, numbness, change in gait,  Psychology  No depression, anxiety, insomnia.   Vitals:  Blood pressure 122/81, pulse 68, temperature 97.9 F (36.6 C), temperature source Oral, resp. rate 18, weight 167 lb 3.2 oz (75.8 kg), SpO2 100 %.  Physical Exam: WD in NAD Neck  Supple NROM, without any enlargements.  Lymph Node Survey No cervical supraclavicular or inguinal adenopathy Psychiatry  Alert and oriented to person, place, and time  Abdomen  Normoactive bowel sounds, abdomen soft, non-tender and thin without evidence of hernia.  Back No CVA tenderness Genito Urinary  Vulva/vagina: Normal external female genitalia.  No lesions. No discharge or bleeding.  Bladder/urethra:  No lesions or masses, well supported bladder  Vagina: normal, no lesions seen see colpo notes below  Cervix: Normal appearing, no lesions. See colpo notes below  Uterus:  Small, mobile, no parametrial involvement or nodularity.  Adnexa: no palpable masses. Rectal  deferred Extremities  No bilateral cyanosis, clubbing or edema.  COLPOSCOPIC  EVALUATION OF VAGINA AND CERVIX.  Speculum placed. Vagina swabbed with 4% acetic acid. Colposcope used to magnify skin of cervix and vagina. No acetowhite lesions or abnormal vasculature identified. TZ seen circumferentially without abnormality.  Acetic acid then applied to 4x4 guaze and vulva and introitus swabbed. No lesions identified under colposcopic visualization.  Impression: normal colposcopy.   Donaciano Eva, MD  11/04/2016, 4:28 PM

## 2016-11-16 ENCOUNTER — Ambulatory Visit: Payer: Medicaid Other | Admitting: Gynecologic Oncology

## 2016-11-23 ENCOUNTER — Ambulatory Visit: Payer: Medicaid Other | Admitting: Gynecologic Oncology

## 2016-12-23 ENCOUNTER — Emergency Department (HOSPITAL_COMMUNITY)
Admission: EM | Admit: 2016-12-23 | Discharge: 2016-12-23 | Disposition: A | Discharge status: Home / Self Care | Payer: Medicaid Other | Attending: Emergency Medicine | Admitting: Emergency Medicine

## 2016-12-23 ENCOUNTER — Emergency Department (HOSPITAL_COMMUNITY): Payer: Medicaid Other

## 2016-12-23 ENCOUNTER — Encounter (HOSPITAL_COMMUNITY): Payer: Self-pay | Admitting: *Deleted

## 2016-12-23 DIAGNOSIS — R05 Cough: Secondary | ICD-10-CM | POA: Insufficient documentation

## 2016-12-23 DIAGNOSIS — B9789 Other viral agents as the cause of diseases classified elsewhere: Secondary | ICD-10-CM

## 2016-12-23 DIAGNOSIS — Z79899 Other long term (current) drug therapy: Secondary | ICD-10-CM | POA: Diagnosis not present

## 2016-12-23 DIAGNOSIS — R509 Fever, unspecified: Secondary | ICD-10-CM | POA: Diagnosis present

## 2016-12-23 DIAGNOSIS — J069 Acute upper respiratory infection, unspecified: Secondary | ICD-10-CM | POA: Diagnosis not present

## 2016-12-23 LAB — RAPID STREP SCREEN (MED CTR MEBANE ONLY): STREPTOCOCCUS, GROUP A SCREEN (DIRECT): NEGATIVE

## 2016-12-23 MED ORDER — BENZONATATE 100 MG PO CAPS: 200.0000 mg | ORAL_CAPSULE | Freq: Two times a day (BID) | ORAL | 0 refills | Status: DC | PRN

## 2016-12-23 MED ORDER — CETIRIZINE HCL 10 MG PO TABS: 10.0000 mg | ORAL_TABLET | Freq: Every day | ORAL | 1 refills | Status: DC

## 2016-12-23 MED ORDER — ONDANSETRON HCL 4 MG PO TABS: 4.0000 mg | ORAL_TABLET | Freq: Four times a day (QID) | ORAL | 0 refills | Status: DC

## 2016-12-23 NOTE — ED Provider Notes (Signed)
[facility] DEPT Provider Note   CSN: [id number] Arrival date & time: 12/23/16  1308     History   Chief Complaint Chief Complaint  Patient presents with  . Fever  . Cough  . Otalgia    HPI Courtney [name] is a 36 y.o. female.  Patient presents to the emergency department with chief complaint of fever, chills, cough, and sore throat. She states that her symptoms started on Monday. The symptoms have been persistent.  She is tried taking OTC medications with minimal relief. She reports that she has been around sick contacts. She did get a flu shot. There are no other associated symptoms or modifying factors.   The history is provided by the patient. No language interpreter was used.    Past Medical History:  Diagnosis Date  . Anemia   . Vaginal delivery 2004, 2008    Patient Active Problem List   Diagnosis Date Noted  . VAIN I (vaginal intraepithelial neoplasia grade I) 10/26/2016    Past Surgical History:  Procedure Laterality Date  . LAPAROSCOPY N/A 05/09/2015   Procedure: LAPAROSCOPY DIAGNOSTIC;  Surgeon: Janyth Pupa, DO;  Location: [facility];  Service: Gynecology;  Laterality: N/A;  [name], [facility] confirmed-08-24-kah  . NO PAST SURGERIES    . SALPINGOOPHORECTOMY Left 05/09/2015   Procedure: SALPINGO OOPHORECTOMY;  Surgeon: Janyth Pupa, DO;  Location: [facility];  Service: Gynecology;  Laterality: Left;    OB History    Gravida Para Term Preterm AB Living   2 2           SAB TAB Ectopic Multiple Live Births                   Home Medications    Prior to Admission medications   Medication Sig Start Date End Date Taking? Authorizing Provider  ibuprofen (ADVIL,MOTRIN) 600 MG tablet Take 1 tablet (600 mg total) by mouth every 6 (six) hours as needed. 05/09/15   Janyth Pupa, DO  methocarbamol (ROBAXIN) 500 MG tablet Take 2 tablets (1,000 mg total) by mouth every 8 (eight) hours as needed for muscle spasms. Patient not taking: Reported on 11/04/2016 10/18/14    Julianne Rice, MD  ondansetron (ZOFRAN) 8 MG tablet Take 1 tablet (8 mg total) by mouth every 8 (eight) hours as needed for nausea or vomiting. Patient not taking: Reported on 11/04/2016 05/07/15   [facility], PA-C  oxyCODONE-acetaminophen (ROXICET) 5-325 MG per tablet Take 1 tablet by mouth every 6 (six) hours as needed for severe pain. Patient not taking: Reported on 11/04/2016 05/09/15   Janyth Pupa, DO    Family History Family History  Problem Relation Age of Onset  . Hypertension Mother   . Hyperlipidemia Mother   . Hypertension Father   . Heart murmur Sister   . Cancer Maternal Grandmother   . Cancer Maternal Grandfather   . Stroke Paternal Grandmother   . Cancer Paternal Grandfather     Social History Social History  Substance Use Topics  . Smoking status: Never Smoker  . Smokeless tobacco: Never Used  . Alcohol use No     Allergies   Bee venom   Review of Systems Review of Systems  All other systems reviewed and are negative.    Physical Exam Updated Vital Signs BP 128/84 (BP Location: Left Arm)   Pulse 92   Temp 99.7 F (37.6 C) (Oral)   Resp (!) 22   Ht 5\' 8"  (1.727 m)   Wt 75.3 kg  LMP 12/22/2016   SpO2 99%   BMI 25.24 kg/m   Physical Exam Physical Exam  Constitutional: Pt  is oriented to person, place, and time. Appears well-developed and well-nourished. No distress.  HENT:  Head: Normocephalic and atraumatic.  Right Ear: Tympanic membrane, external ear and ear canal normal.  Left Ear: Tympanic membrane, external ear and ear canal normal.  Nose: Mucosal edema and moderate rhinorrhea present. No epistaxis. Right sinus exhibits no maxillary sinus tenderness and no frontal sinus tenderness. Left sinus exhibits no maxillary sinus tenderness and no frontal sinus tenderness.  Mouth/Throat: Uvula is midline and mucous membranes are normal. Mucous membranes are not pale and not cyanotic. No oropharyngeal exudate, posterior oropharyngeal edema,  posterior oropharyngeal erythema or tonsillar abscesses.  Eyes: Conjunctivae are normal. Pupils are equal, round, and reactive to light.  Neck: Normal range of motion and full passive range of motion without pain.  Cardiovascular: Normal rate and intact distal pulses.   Pulmonary/Chest: Effort normal and breath sounds normal. No stridor.  Clear and equal breath sounds without focal wheezes, rhonchi, rales  Abdominal: Soft. Bowel sounds are normal. There is no tenderness.  Musculoskeletal: Normal range of motion.  Lymphadenopathy:    Pthas no cervical adenopathy.  Neurological: Pt is alert and oriented to person, place, and time.  Skin: Skin is warm and dry. No rash noted. Pt is not diaphoretic.  Psychiatric: Normal mood and affect.  Nursing note and vitals reviewed.    ED Treatments / Results  Labs (all labs ordered are listed, but only abnormal results are displayed) Labs Reviewed  RAPID STREP SCREEN (NOT AT [facility])  CULTURE, GROUP A STREP [facility])    EKG  EKG Interpretation None       Radiology Dg Chest 2 View  Result Date: 12/23/2016 CLINICAL DATA:  Acute onset of cough, fever and generalized chest pain. Initial encounter. EXAM: CHEST  2 VIEW COMPARISON:  Chest radiograph from 02/12/2016 FINDINGS: The lungs are well-aerated and clear. There is no evidence of focal opacification, pleural effusion or pneumothorax. The heart is normal in size; the mediastinal contour is within normal limits. No acute osseous abnormalities are seen. Bilateral metallic nipple piercings are noted. IMPRESSION: No acute cardiopulmonary process seen. Electronically Signed   By: Garald Balding M.D.   On: 12/23/2016 04:22    Procedures Procedures (including critical care time)  Medications Ordered in ED Medications - No data to display   Initial Impression / Assessment and Plan / ED Course  I have reviewed the triage vital signs and the nursing notes.  Pertinent labs & imaging results that were  available during my care of the patient were reviewed by me and considered in my medical decision making (see chart for details).     Pt CXR negative for acute infiltrate. Patients symptoms are consistent with URI, likely viral etiology. Discussed that antibiotics are not indicated for viral infections. Pt will be discharged with symptomatic treatment.  Verbalizes understanding and is agreeable with plan. Pt is hemodynamically stable & in NAD prior to dc.   Final Clinical Impressions(s) / ED Diagnoses   Final diagnoses:  Viral URI with cough    New Prescriptions Discharge Medication List as of 12/23/2016  7:34 AM    START taking these medications   Details  benzonatate (TESSALON) 100 MG capsule Take 2 capsules (200 mg total) by mouth 2 (two) times daily as needed for cough., Starting Wed 12/23/2016, Print    cetirizine (ZYRTEC ALLERGY) 10 MG tablet Take 1  tablet (10 mg total) by mouth daily., Starting Wed 12/23/2016, Print         Montine Circle, PA-C 12/23/16 [id number]    Isla Pence, MD 12/23/16 [phone]

## 2016-12-23 NOTE — ED Triage Notes (Signed)
c/o earache and cough productive at times onset Monday. States her chest hurts when she coughs .

## 2016-12-25 ENCOUNTER — Encounter: Payer: Self-pay | Admitting: Gynecologic Oncology

## 2016-12-25 ENCOUNTER — Ambulatory Visit: Payer: Medicaid Other | Attending: Gynecologic Oncology | Admitting: Gynecologic Oncology

## 2016-12-25 ENCOUNTER — Other Ambulatory Visit (HOSPITAL_COMMUNITY)
Admission: RE | Admit: 2016-12-25 | Discharge: 2016-12-25 | Disposition: A | Discharge status: Home / Self Care | Payer: Medicaid Other | Source: Ambulatory Visit | Attending: Gynecologic Oncology | Admitting: Gynecologic Oncology

## 2016-12-25 VITALS — BP 113/80 | HR 75 | Temp 98.6°F | Resp 18 | Wt 163.5 lb

## 2016-12-25 DIAGNOSIS — N890 Mild vaginal dysplasia: Secondary | ICD-10-CM | POA: Insufficient documentation

## 2016-12-25 DIAGNOSIS — Z79899 Other long term (current) drug therapy: Secondary | ICD-10-CM | POA: Insufficient documentation

## 2016-12-25 DIAGNOSIS — Z809 Family history of malignant neoplasm, unspecified: Secondary | ICD-10-CM | POA: Diagnosis not present

## 2016-12-25 DIAGNOSIS — Z8249 Family history of ischemic heart disease and other diseases of the circulatory system: Secondary | ICD-10-CM | POA: Diagnosis not present

## 2016-12-25 DIAGNOSIS — Z888 Allergy status to other drugs, medicaments and biological substances status: Secondary | ICD-10-CM | POA: Insufficient documentation

## 2016-12-25 DIAGNOSIS — Z823 Family history of stroke: Secondary | ICD-10-CM | POA: Diagnosis not present

## 2016-12-25 DIAGNOSIS — Z9889 Other specified postprocedural states: Secondary | ICD-10-CM | POA: Diagnosis not present

## 2016-12-25 DIAGNOSIS — Z87411 Personal history of vaginal dysplasia: Secondary | ICD-10-CM

## 2016-12-25 LAB — CULTURE, GROUP A STREP (THRC)

## 2016-12-25 NOTE — Patient Instructions (Signed)
Please follow-up with Dr Nelda Marseille in 1 year (April, 2019) for pap surveillance testing.  Dr Serita Grit office will call you with your pap results.

## 2016-12-25 NOTE — Progress Notes (Signed)
Consult Note: Gyn-Onc  Consult was requested by Dr. Nelda Marseille for the evaluation of Courtney [name] 36 y.o. female  CC:  Chief Complaint  Patient presents with  . VAIN l    Assessment/Plan:  Courtney [name]  is a 36 y.o.  year old with a history of left vaginal introitus VAIN1.  There is no grossly visible lesion at present today's exam. Pap testing performed.   If unremarkable, recommend repeat visit with Dr Nelda Marseille for ongoing surveillance in 12 months (April, 2019).   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  The patient has a remote history of HPV infection at age 43. She reports feeling an itchy "bump" on her vagina that she informed Dr Nelda Marseille about in November, 2017. Dr Nelda Marseille identifed a 28mm wart like lesion in the vagina (unclear of specific location) which she removed at that time (08/04/16). Pathology revealed VAIN I.  Since that time the patient has not appreciated symptoms of pruritis or vaginal bleeding.  She cannot specifically remember the date of her last pap smear and these results are not available to [place]. She does know that she is due for pap surveillance in April, 2018. She does not known if HPV co-testing has been performed.  Interval Hx: She underwent colposcopy on 11/04/16 and no vaginal lesions were seen.  She is due for a pap today.  Current Meds:  Outpatient Encounter Prescriptions as of 12/25/2016  Medication Sig  . benzonatate (TESSALON) 100 MG capsule Take 2 capsules (200 mg total) by mouth 2 (two) times daily as needed for cough.  [name] ibuprofen (ADVIL,MOTRIN) 600 MG tablet Take 1 tablet (600 mg total) by mouth every 6 (six) hours as needed.  . methocarbamol (ROBAXIN) 500 MG tablet Take 2 tablets (1,000 mg total) by mouth every 8 (eight) hours as needed for muscle spasms.  . ondansetron (ZOFRAN) 4 MG tablet Take 1 tablet (4 mg total) by mouth every 6 (six)  hours.  [name] oxyCODONE-acetaminophen (ROXICET) 5-325 MG per tablet Take 1 tablet by mouth every 6 (six) hours as needed for severe pain.  . [DISCONTINUED] acetaminophen (TYLENOL) 325 MG tablet Take 650 mg by mouth every 6 (six) hours as needed for fever.  . [DISCONTINUED] cetirizine (ZYRTEC ALLERGY) 10 MG tablet Take 1 tablet (10 mg total) by mouth daily.   No facility-administered encounter medications on file as of 12/25/2016.     Allergy:  Allergies  Allergen Reactions  . Bee Venom Anaphylaxis    Social Hx:   Social History   Social History  . Marital status: Single    Spouse name: N/A  . Number of children: N/A  . Years of education: N/A   Occupational History  . Not on file.   Social History Main Topics  . Smoking status: Never Smoker  . Smokeless tobacco: Never Used  . Alcohol use No  . Drug use: No  . Sexual activity: Yes    Birth control/ protection: Injection     Comment: Depo Provera   Other Topics Concern  . Not on file   Social History Narrative  . No narrative on file    Past Surgical Hx:  Past Surgical History:  Procedure Laterality Date  . LAPAROSCOPY N/A 05/09/2015   Procedure: LAPAROSCOPY DIAGNOSTIC;  Surgeon: Janyth Pupa, DO;  Location: [facility];  Service: Gynecology;  Laterality: N/A;  [name], [facility] confirmed-08-24-kah  . NO PAST SURGERIES    . SALPINGOOPHORECTOMY Left 05/09/2015   Procedure: SALPINGO OOPHORECTOMY;  Surgeon: Janyth Pupa, DO;  Location: [facility];  Service: Gynecology;  Laterality: Left;    Past Medical Hx:  Past Medical History:  Diagnosis Date  . Anemia   . Vaginal delivery 2004, 2008    Past Gynecological History:  Oophorectomy for benign lesion.  Patient's last menstrual period was 12/22/2016.  Family Hx:  Family History  Problem Relation Age of Onset  . Hypertension Mother   . Hyperlipidemia Mother   . Hypertension Father   . Heart murmur Sister   . Cancer Maternal Grandmother   . Cancer Maternal Grandfather   .  Stroke Paternal Grandmother   . Cancer Paternal Grandfather     Review of Systems:  Constitutional  Feels well,    ENT Normal appearing ears and nares bilaterally Skin/Breast  No rash, sores, jaundice, itching, dryness Cardiovascular  No chest pain, shortness of breath, or edema  Pulmonary  No cough or wheeze.  Gastro Intestinal  No nausea, vomitting, or diarrhoea. No bright red blood per rectum, no abdominal pain, change in bowel movement, or constipation.  Genito Urinary  No frequency, urgency, dysuria, history of vaginal pruritis. Musculo Skeletal  No myalgia, arthralgia, joint swelling or pain  Neurologic  No weakness, numbness, change in gait,  Psychology  No depression, anxiety, insomnia.   Vitals:  Blood pressure 113/80, pulse 75, temperature 98.6 F (37 C), resp. rate 18, weight 163 lb 8 oz (74.2 kg), last menstrual period 12/22/2016.  Physical Exam: WD in NAD Neck  Supple NROM, without any enlargements.  Lymph Node Survey No cervical supraclavicular or inguinal adenopathy Psychiatry  Alert and oriented to person, place, and time  Abdomen  Normoactive bowel sounds, abdomen soft, non-tender and thin without evidence of hernia.  Back No CVA tenderness Genito Urinary  Vulva/vagina: Normal external female genitalia.  No lesions. No discharge or bleeding.  Bladder/urethra:  No lesions or masses, well supported bladder  Vagina: normal, no lesions seen see colpo notes below  Cervix: Normal appearing, no lesions. Pap taken.  Uterus:  Small, mobile, no parametrial involvement or nodularity.  Adnexa: no palpable masses. Rectal  deferred Extremities  No bilateral cyanosis, clubbing or edema.   Donaciano Eva, MD  12/25/2016, 2:55 PM

## 2016-12-30 LAB — CYTOLOGY - PAP
DIAGNOSIS: NEGATIVE
HPV (WINDOPATH): NOT DETECTED

## 2017-01-08 ENCOUNTER — Telehealth: Payer: Self-pay

## 2017-01-08 NOTE — Telephone Encounter (Signed)
s/w pt that pap was negative and HPV was negative. Repeat in 5 years with her obgyn. Pt was appreciative.

## 2018-03-07 ENCOUNTER — Other Ambulatory Visit: Payer: Self-pay | Admitting: Surgery

## 2018-03-07 DIAGNOSIS — R1031 Right lower quadrant pain: Secondary | ICD-10-CM

## 2018-03-11 ENCOUNTER — Ambulatory Visit
Admission: RE | Admit: 2018-03-11 | Discharge: 2018-03-11 | Disposition: A | Discharge status: Home / Self Care | Payer: Medicaid Other | Source: Ambulatory Visit | Attending: Surgery | Admitting: Surgery

## 2018-03-11 DIAGNOSIS — R1031 Right lower quadrant pain: Secondary | ICD-10-CM

## 2018-03-11 MED ORDER — IOHEXOL 300 MG/ML  SOLN
100.0000 mL | Freq: Once | INTRAMUSCULAR | Status: AC | PRN
  Administered 2018-03-11: 100 mL via INTRAVENOUS

## 2018-04-11 IMAGING — CR DG CHEST 2V
2 series · 2 of 2 positions shown · non-contrast
Comparison: 11/07/2014.

CLINICAL DATA: Shortness of breath.  Cough.

EXAM:
CHEST  2 VIEW

[w chest pa]
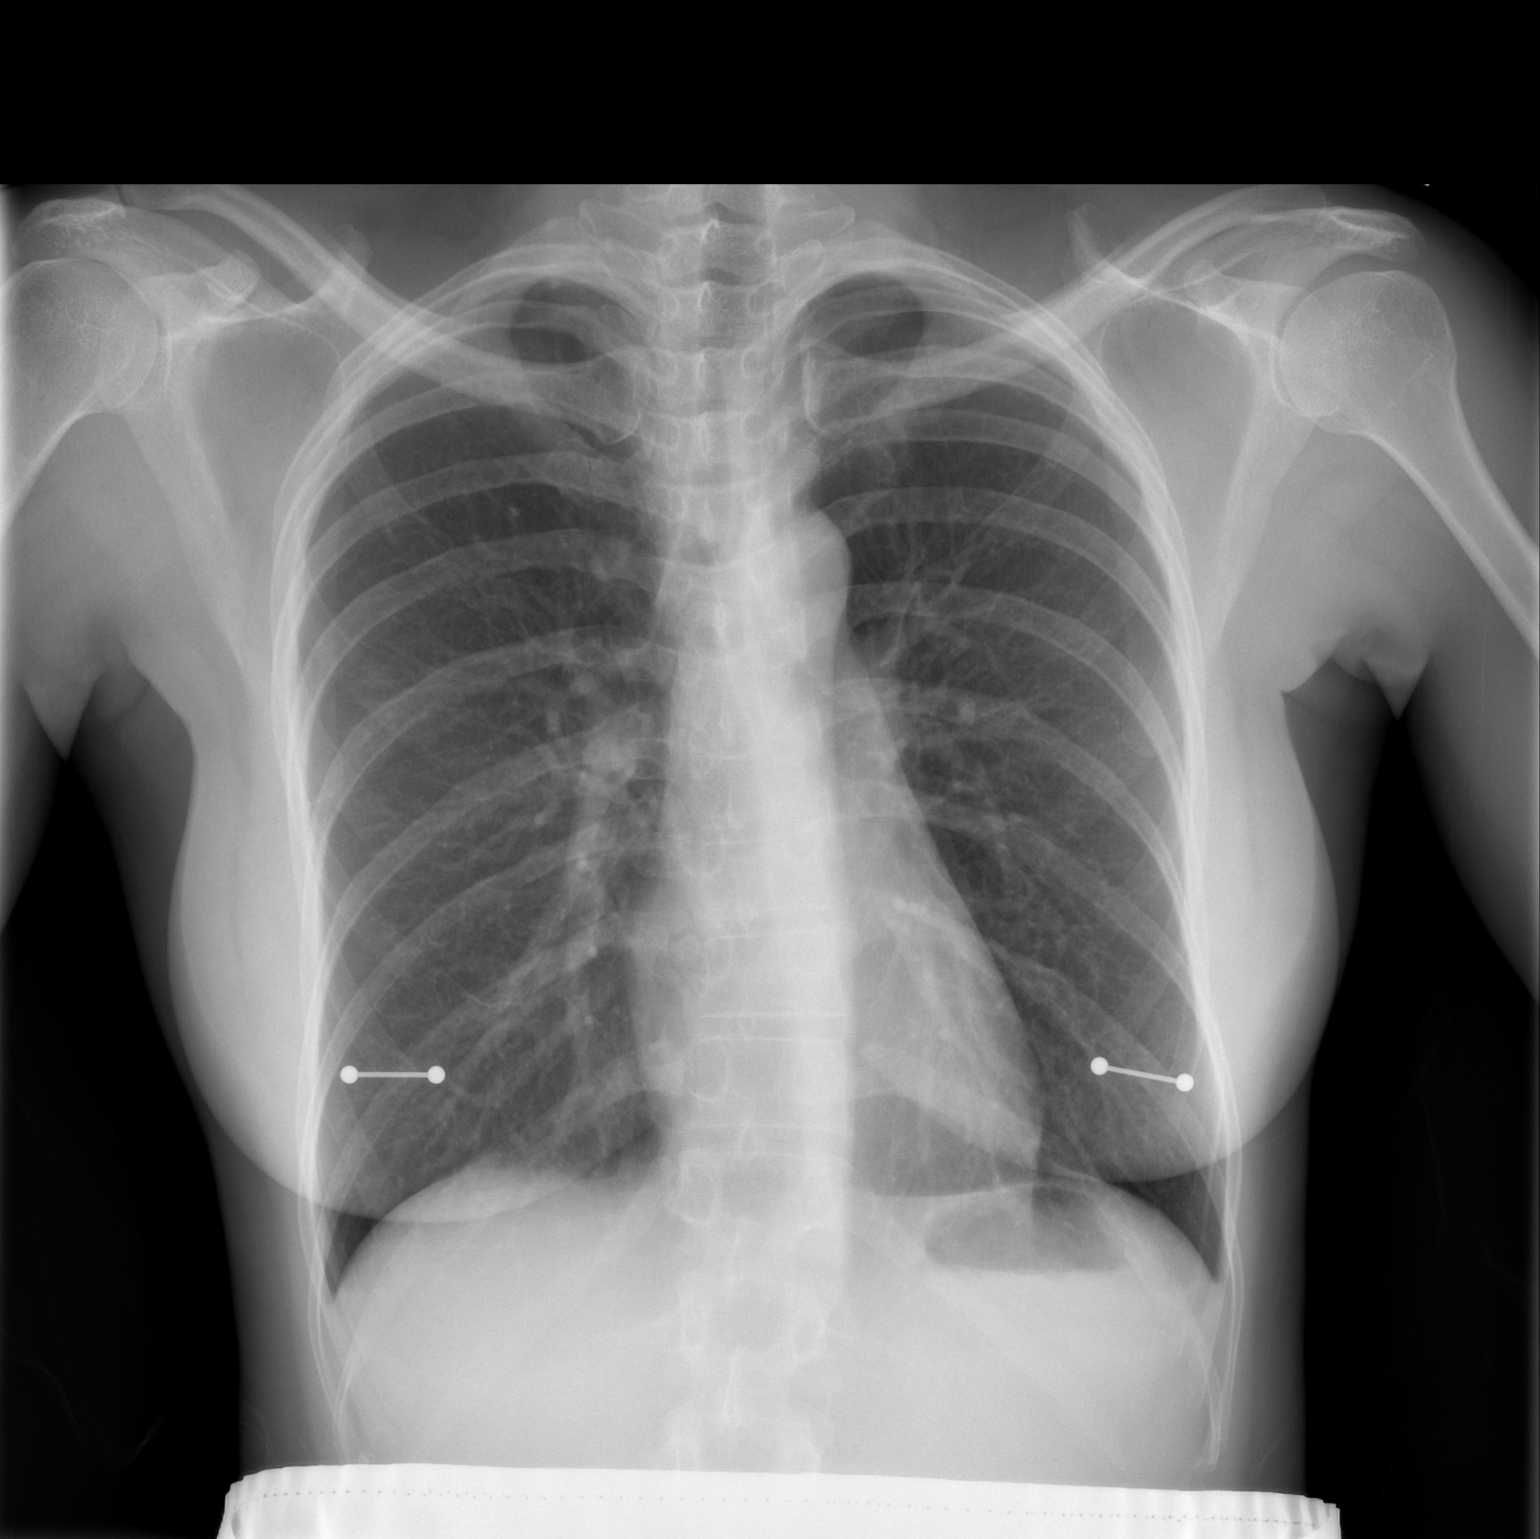

[w chest lat]
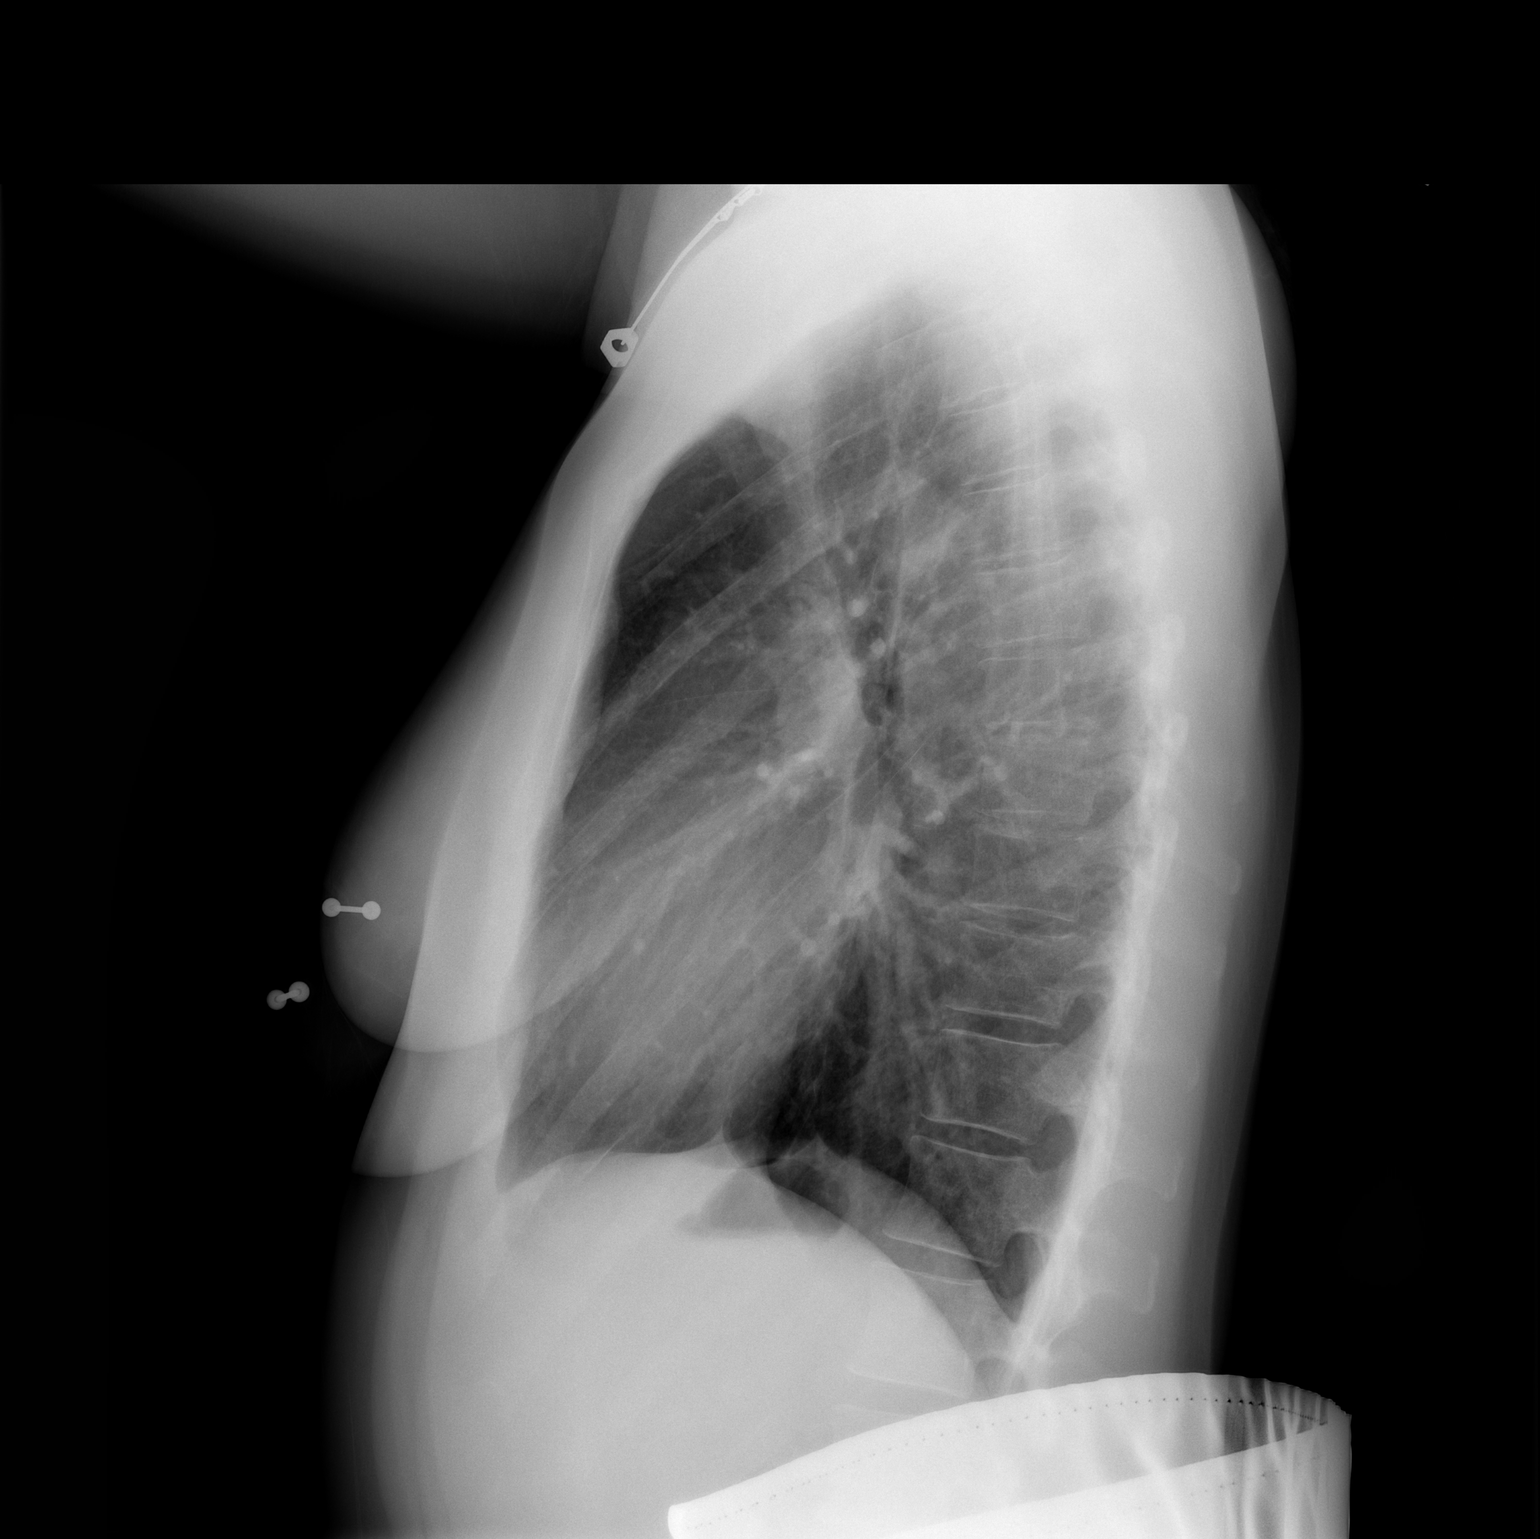

[2 of 2 positions shown; findings below may reference images not displayed]

FINDINGS: Mediastinum hilar structures are normal. Lungs are clear. Heart size
normal. No pleural effusion or pneumothorax. Mild thoracic spine
scoliosis.
IMPRESSION: No acute cardiopulmonary disease.

## 2018-06-01 ENCOUNTER — Other Ambulatory Visit: Payer: Self-pay | Admitting: Surgery

## 2018-06-01 DIAGNOSIS — R1031 Right lower quadrant pain: Secondary | ICD-10-CM

## 2018-06-20 ENCOUNTER — Ambulatory Visit
Admission: RE | Admit: 2018-06-20 | Discharge: 2018-06-20 | Disposition: A | Discharge status: Home / Self Care | Payer: Medicaid Other | Source: Ambulatory Visit | Attending: Surgery | Admitting: Surgery

## 2018-06-20 DIAGNOSIS — R1031 Right lower quadrant pain: Secondary | ICD-10-CM

## 2018-07-04 ENCOUNTER — Encounter (HOSPITAL_COMMUNITY): Payer: Self-pay | Admitting: Emergency Medicine

## 2018-07-04 ENCOUNTER — Emergency Department (HOSPITAL_COMMUNITY)
Admission: EM | Admit: 2018-07-04 | Discharge: 2018-07-04 | Disposition: A | Discharge status: Home / Self Care | Payer: Medicaid Other | Attending: Emergency Medicine | Admitting: Emergency Medicine

## 2018-07-04 DIAGNOSIS — Z23 Encounter for immunization: Secondary | ICD-10-CM | POA: Insufficient documentation

## 2018-07-04 DIAGNOSIS — S40811A Abrasion of right upper arm, initial encounter: Secondary | ICD-10-CM

## 2018-07-04 DIAGNOSIS — Y999 Unspecified external cause status: Secondary | ICD-10-CM | POA: Insufficient documentation

## 2018-07-04 DIAGNOSIS — Y939 Activity, unspecified: Secondary | ICD-10-CM | POA: Insufficient documentation

## 2018-07-04 DIAGNOSIS — S51812A Laceration without foreign body of left forearm, initial encounter: Secondary | ICD-10-CM | POA: Insufficient documentation

## 2018-07-04 DIAGNOSIS — Z79899 Other long term (current) drug therapy: Secondary | ICD-10-CM | POA: Diagnosis not present

## 2018-07-04 DIAGNOSIS — Y929 Unspecified place or not applicable: Secondary | ICD-10-CM | POA: Diagnosis not present

## 2018-07-04 DIAGNOSIS — W540XXA Bitten by dog, initial encounter: Secondary | ICD-10-CM | POA: Insufficient documentation

## 2018-07-04 DIAGNOSIS — S59912A Unspecified injury of left forearm, initial encounter: Secondary | ICD-10-CM | POA: Diagnosis present

## 2018-07-04 MED ORDER — LIDOCAINE HCL (PF) 1 % IJ SOLN
5.0000 mL | Freq: Once | INTRAMUSCULAR | Status: AC
Start: 2018-07-04 — End: 2018-07-04
  Administered 2018-07-04: 5 mL via INTRADERMAL
  Filled 2018-07-04: qty 5

## 2018-07-04 MED ORDER — AMOXICILLIN-POT CLAVULANATE 500-125 MG PO TABS: 1.0000 | ORAL_TABLET | Freq: Three times a day (TID) | ORAL | 0 refills | Status: DC

## 2018-07-04 MED ORDER — BACITRACIN-NEOMYCIN-POLYMYXIN OINTMENT TUBE
TOPICAL_OINTMENT | Freq: Once | CUTANEOUS | Status: AC
  Administered 2018-07-04: 07:00:00 via TOPICAL
  Filled 2018-07-04: qty 14

## 2018-07-04 MED ORDER — BACIT-POLY-NEO HC 1 % EX OINT
TOPICAL_OINTMENT | Freq: Once | CUTANEOUS | Status: AC
  Administered 2018-07-04: 07:00:00 via TOPICAL
  Filled 2018-07-04: qty 15

## 2018-07-04 MED ORDER — TETANUS-DIPHTH-ACELL PERTUSSIS 5-2.5-18.5 LF-MCG/0.5 IM SUSP
0.5000 mL | Freq: Once | INTRAMUSCULAR | Status: AC
Start: 2018-07-04 — End: 2018-07-04
  Administered 2018-07-04: 0.5 mL via INTRAMUSCULAR
  Filled 2018-07-04: qty 0.5

## 2018-07-04 NOTE — ED Provider Notes (Signed)
[facility] EMERGENCY DEPARTMENT Provider Note   CSN: [id number] Arrival date & time: 07/04/18  0557     History   Chief Complaint Chief Complaint  Patient presents with  . Animal Bite    HPI Courtney [name] is a 37 y.o. female.  Patient is a 37 year old female with no significant past medical history.  She presents with complaints of a dog bite to her left forearm.  She states that she was bitten by her own dog (who shots are up-to-date) this morning.  The dog apparently had a shock collar on when it became confused and agitated when being shocked.  It then bit her on the forearm causing a laceration.  The history is provided by the patient.  Animal Bite  Contact animal:  Dog Animal bite location: forearm. Time since incident:  1 hour Pain details:    Quality:  Aching   Severity:  Moderate   Timing:  Constant   Progression:  Unchanged Incident location:  Home Provoked: unprovoked   Notifications:  None Animal's rabies vaccination status:  Up to date Animal in possession: yes   Tetanus status: 6 years ago. Relieved by:  Nothing Worsened by:  Nothing Ineffective treatments:  None tried   Past Medical History:  Diagnosis Date  . Anemia   . Vaginal delivery 2004, 2008    Patient Active Problem List   Diagnosis Date Noted  . VAIN I (vaginal intraepithelial neoplasia grade I) 10/26/2016    Past Surgical History:  Procedure Laterality Date  . LAPAROSCOPY N/A 05/09/2015   Procedure: LAPAROSCOPY DIAGNOSTIC;  Surgeon: Janyth Pupa, DO;  Location: [facility];  Service: Gynecology;  Laterality: N/A;  [name], [facility] confirmed-08-24-kah  . NO PAST SURGERIES    . SALPINGOOPHORECTOMY Left 05/09/2015   Procedure: SALPINGO OOPHORECTOMY;  Surgeon: Janyth Pupa, DO;  Location: [facility];  Service: Gynecology;  Laterality: Left;     OB History    Gravida  2   Para  2   Term      Preterm      AB      Living        SAB      TAB      Ectopic     Multiple      Live Births               Home Medications    Prior to Admission medications   Medication Sig Start Date End Date Taking? Authorizing Provider  benzonatate (TESSALON) 100 MG capsule Take 2 capsules (200 mg total) by mouth 2 (two) times daily as needed for cough. 12/23/16   Montine Circle, PA-C  ibuprofen (ADVIL,MOTRIN) 600 MG tablet Take 1 tablet (600 mg total) by mouth every 6 (six) hours as needed. 05/09/15   Janyth Pupa, DO  methocarbamol (ROBAXIN) 500 MG tablet Take 2 tablets (1,000 mg total) by mouth every 8 (eight) hours as needed for muscle spasms. 10/18/14   Julianne Rice, MD  ondansetron (ZOFRAN) 4 MG tablet Take 1 tablet (4 mg total) by mouth every 6 (six) hours. 12/23/16   Montine Circle, PA-C  oxyCODONE-acetaminophen (ROXICET) 5-325 MG per tablet Take 1 tablet by mouth every 6 (six) hours as needed for severe pain. 05/09/15   Janyth Pupa, DO    Family History Family History  Problem Relation Age of Onset  . Hypertension Mother   . Hyperlipidemia Mother   . Hypertension Father   . Heart murmur Sister   . Cancer  Maternal Grandmother   . Cancer Maternal Grandfather   . Stroke Paternal Grandmother   . Cancer Paternal Grandfather     Social History Social History   Tobacco Use  . Smoking status: Never Smoker  . Smokeless tobacco: Never Used  Substance Use Topics  . Alcohol use: No  . Drug use: No     Allergies   Bee venom   Review of Systems Review of Systems  All other systems reviewed and are negative.    Physical Exam Updated Vital Signs BP (!) 129/104 (BP Location: Right Arm)   Pulse 84   Temp 98.1 F (36.7 C) (Oral)   Resp 18   Ht 5\' 8"  (1.727 m)   Wt 74.2 kg   SpO2 98%   BMI 24.87 kg/m   Physical Exam  Constitutional: She is oriented to person, place, and time. She appears well-developed and well-nourished. No distress.  HENT:  Head: Normocephalic and atraumatic.  Neck: Normal range of motion. Neck supple.    Pulmonary/Chest: Effort normal.  Musculoskeletal:  There are superficial abrasions to the right forearm.  The left forearm has a 2.5 cm, gaping laceration with fat globules exposed.  There is no evidence for tendon injury.  She is able to flex all fingers and wrist.  Distal pulses, motor, and sensory are all intact.  Neurological: She is alert and oriented to person, place, and time.  Skin: She is not diaphoretic.  Nursing note and vitals reviewed.    ED Treatments / Results  Labs (all labs ordered are listed, but only abnormal results are displayed) Labs Reviewed - No data to display  EKG None  Radiology No results found.  Procedures Procedures (including critical care time)  Medications Ordered in ED Medications  lidocaine (PF) (XYLOCAINE) 1 % injection 5 mL (has no administration in time range)  Tdap (BOOSTRIX) injection 0.5 mL (has no administration in time range)  bacitracin-neomycin-polymyxin-hydrocortisone (CORTISPORIN) 1 % ointment (has no administration in time range)     Initial Impression / Assessment and Plan / ED Course  I have reviewed the triage vital signs and the nursing notes.  Pertinent labs & imaging results that were available during my care of the patient were reviewed by me and considered in my medical decision making (see chart for details).  Patient here with a laceration to the left forearm sustained from a dog bite.  The dog belongs to the patient and shots are up-to-date.  Patient was advised to observe the dog for the next 10 days for illness or change in behavior.  If this is to happen, rabies series needs to be entertained.  Patient's last tetanus shot was 6 days ago.  Since this is a dog bite, I will update this.  The wound itself is approximately 2.5 cm in length and gaping.  There is subcutaneous fatty tissue exuding from the wound.  I have discussed treatment options with the patient who is not comfortable with leaving the wound open as  is.  I have agreed to place 2 loose sutures to keep the edges in approximation, however not completely close off the wound.  She understands that the sutures increase the risk of infection and is willing to go along with this.  She will be treated with Augmentin for prevention of infection, local wound care, and sutures are to be removed in 1 week.  LACERATION REPAIR Performed by: Veryl Speak Authorized by: Veryl Speak Consent: Verbal consent obtained. Risks and benefits: risks, benefits and  alternatives were discussed Consent given by: patient Patient identity confirmed: provided demographic data Prepped and Draped in normal sterile fashion Wound explored  Laceration Location: left forearm  Laceration Length: 2.5cm  No Foreign Bodies seen or palpated  Anesthesia: local infiltration  Local anesthetic: lidocaine 1% without epinephrine  Anesthetic total: 2 ml  Irrigation method: syringe Amount of cleaning: standard  Skin closure: 4-0  Number of sutures: 2  Technique: simple interrupted  Patient tolerance: Patient tolerated the procedure well with no immediate complications.   Final Clinical Impressions(s) / ED Diagnoses   Final diagnoses:  None    ED Discharge Orders    None       Veryl Speak, MD 07/04/18 [phone]

## 2018-07-04 NOTE — Discharge Instructions (Addendum)
Augmentin as prescribed.  Keep wound clean, dry, and covered.  Local wound care with bacitracin and dressing changes twice daily.  Return to the emergency department if you develop increased redness around the wound, purulent drainage, streaks up and down the arm, increasing pain, or other new and concerning symptoms.  Sutures are to be removed in 1 week.  Please follow-up with your primary doctor for this.

## 2018-07-04 NOTE — ED Triage Notes (Signed)
Reports being bit by her dog this morning on both arms.  Bite on left arm open and bleeding.  Dogs shots are up to date.

## 2019-11-24 ENCOUNTER — Telehealth: Payer: Self-pay

## 2019-11-24 NOTE — Telephone Encounter (Signed)
Pt is no a part of this practice

## 2019-12-20 ENCOUNTER — Ambulatory Visit: Payer: Self-pay | Admitting: Cardiology

## 2019-12-20 ENCOUNTER — Encounter: Payer: Self-pay | Admitting: Cardiology

## 2019-12-20 ENCOUNTER — Other Ambulatory Visit: Payer: Self-pay

## 2019-12-20 VITALS — BP 109/84 | HR 73 | Temp 97.2°F | Ht 68.0 in | Wt 180.0 lb

## 2019-12-20 DIAGNOSIS — Z8249 Family history of ischemic heart disease and other diseases of the circulatory system: Secondary | ICD-10-CM

## 2019-12-20 DIAGNOSIS — R0602 Shortness of breath: Secondary | ICD-10-CM

## 2019-12-20 DIAGNOSIS — I10 Essential (primary) hypertension: Secondary | ICD-10-CM

## 2019-12-20 DIAGNOSIS — Z13220 Encounter for screening for lipoid disorders: Secondary | ICD-10-CM

## 2019-12-20 NOTE — Progress Notes (Signed)
Patient referred by Wenda Low, MD for exertional dyspnea  Subjective:   Courtney [name], female    DOB: [birth year], 39 y.o.   MRN: [id number]   Chief Complaint  Patient presents with  . Hypertension  . Shortness of Breath  . Hx of Heart Attacks  . New Patient (Initial Visit)     HPI  39 y.o. African American female with exertional dyspnea.  Patient has family history of resistant hypertension. She herself has had variable blood pressure readings in the past, currently not on any antihypertensive medications. She has noticed exertional dyspnea with climbing up stairs, and occasional leg edema. She denies any chest pain. Patient also has family h/o early CAD, with mother having had angioplasty in her early 19s, and maternal grandmother mother who died of a fatal MI at age 80.  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 Patient has restarted exercising regularly, walks 2 miles a day. She does notice dyspnea on walking up the hill or walking upstairs.   Past Medical History:  Diagnosis Date  . Anemia   . Vaginal delivery 2004, 2008     Past Surgical History:  Procedure Laterality Date  . LAPAROSCOPY N/A 05/09/2015   Procedure: LAPAROSCOPY DIAGNOSTIC;  Surgeon: Janyth Pupa, DO;  Location: [facility];  Service: Gynecology;  Laterality: N/A;  [name], [facility] confirmed-08-24-kah  . NO PAST SURGERIES    . SALPINGOOPHORECTOMY Left 05/09/2015   Procedure: SALPINGO OOPHORECTOMY;  Surgeon: Janyth Pupa, DO;  Location: [facility];  Service: Gynecology;  Laterality: Left;     Social History   Tobacco Use  Smoking Status Never Smoker  Smokeless Tobacco Never Used    Social History   Substance and Sexual Activity  Alcohol Use No     Family History  Problem Relation Age of Onset  . Hypertension Mother   . Hyperlipidemia Mother   . Hypertension Father   . Heart murmur Sister   .  Cancer Maternal Grandmother   . Cancer Maternal Grandfather   . Stroke Paternal Grandmother   . Cancer Paternal Grandfather      Current Outpatient Medications on File Prior to Visit  Medication Sig Dispense Refill  . benzonatate (TESSALON) 100 MG capsule Take 2 capsules (200 mg total) by mouth 2 (two) times daily as needed for cough. (Patient not taking: Reported on 12/20/2019) 20 capsule 0   No current facility-administered medications on file prior to visit.    Cardiovascular and other pertinent studies:  EKG 12/20/2019: Sinus rhythm 71 bpm.  Borderline left atrial enlargement.   Recent labs: No labs available   Review of Systems  Cardiovascular: Positive for dyspnea on exertion and leg swelling. Negative for chest pain, palpitations and syncope.         Vitals:   12/20/19 1019  BP: 109/84  Pulse: 73  Temp: (!) 97.2 F (36.2 C)  SpO2: 98%     Body mass index is 27.37 kg/m. Filed Weights   12/20/19 1019  Weight: 180 lb (81.6 kg)     Objective:   Physical Exam  Constitutional: She appears well-developed and well-nourished.  Neck: No JVD present.  Cardiovascular: Normal rate, regular rhythm, normal heart sounds and intact distal pulses.  No murmur heard. Pulmonary/Chest: Effort normal and breath sounds normal. She has no wheezes. She has no rales.  Musculoskeletal:        General: No edema.  Nursing note and  vitals reviewed.      Assessment & Recommendations:   39 y.o. African American female with exertional dyspnea, possibly labile hypertension.  We will check echocardiogram and CMP. Currently, blood pressure is well controlled. Discussed low-salt diet, and importance of regular exercise.   Given her family strip early CAD, I will check lipid panel and calcium score scan.  I will see her on as-needed basis, unless significant abnormalities found on above testing.   Thank you for referring the patient to [place]. Please feel free to contact with any  questions.  Nigel Mormon, MD [facility] Pager: [phone] Office: [phone]

## 2019-12-21 ENCOUNTER — Other Ambulatory Visit: Payer: Self-pay

## 2019-12-21 DIAGNOSIS — R0602 Shortness of breath: Secondary | ICD-10-CM

## 2019-12-23 ENCOUNTER — Other Ambulatory Visit (HOSPITAL_COMMUNITY): Payer: Self-pay

## 2019-12-27 LAB — COMPREHENSIVE METABOLIC PANEL
ALT: 13 IU/L (ref 0–32)
AST: 17 IU/L (ref 0–40)
Albumin/Globulin Ratio: 1.5 (ref 1.2–2.2)
Albumin: 4.1 g/dL (ref 3.8–4.8)
Alkaline Phosphatase: 71 IU/L (ref 39–117)
BUN/Creatinine Ratio: 9 (ref 9–23)
BUN: 8 mg/dL (ref 6–20)
Bilirubin Total: 0.5 mg/dL (ref 0.0–1.2)
CO2: 23 mmol/L (ref 20–29)
Calcium: 9 mg/dL (ref 8.7–10.2)
Chloride: 106 mmol/L (ref 96–106)
Creatinine, Ser: 0.86 mg/dL (ref 0.57–1.00)
GFR calc Af Amer: 99 mL/min/{1.73_m2} (ref >59)
GFR calc non Af Amer: 86 mL/min/{1.73_m2} (ref >59)
Globulin, Total: 2.8 g/dL (ref 1.5–4.5)
Glucose: 90 mg/dL (ref 65–99)
Potassium: 4.2 mmol/L (ref 3.5–5.2)
Sodium: 141 mmol/L (ref 134–144)
Total Protein: 6.9 g/dL (ref 6.0–8.5)

## 2019-12-29 ENCOUNTER — Inpatient Hospital Stay (HOSPITAL_COMMUNITY): Admission: RE | Admit: 2019-12-29 | Payer: Self-pay | Source: Ambulatory Visit

## 2019-12-30 ENCOUNTER — Other Ambulatory Visit (HOSPITAL_COMMUNITY)
Admission: RE | Admit: 2019-12-30 | Discharge: 2019-12-30 | Disposition: A | Discharge status: Home / Self Care | Payer: Medicaid Other | Source: Ambulatory Visit | Attending: Cardiology | Admitting: Cardiology

## 2019-12-30 DIAGNOSIS — Z01812 Encounter for preprocedural laboratory examination: Secondary | ICD-10-CM | POA: Diagnosis not present

## 2019-12-30 DIAGNOSIS — Z20822 Contact with and (suspected) exposure to covid-19: Secondary | ICD-10-CM | POA: Insufficient documentation

## 2019-12-30 LAB — SARS CORONAVIRUS 2 (TAT 6-24 HRS): SARS Coronavirus 2: NEGATIVE

## 2020-01-01 ENCOUNTER — Other Ambulatory Visit: Payer: Self-pay

## 2020-01-01 ENCOUNTER — Ambulatory Visit: Payer: Medicaid Other

## 2020-01-03 ENCOUNTER — Other Ambulatory Visit: Payer: Self-pay | Admitting: Internal Medicine

## 2020-01-03 ENCOUNTER — Ambulatory Visit
Admission: RE | Admit: 2020-01-03 | Discharge: 2020-01-03 | Disposition: A | Discharge status: Home / Self Care | Payer: Medicaid Other | Source: Ambulatory Visit | Attending: Internal Medicine | Admitting: Internal Medicine

## 2020-01-03 ENCOUNTER — Other Ambulatory Visit: Payer: Self-pay

## 2020-01-03 DIAGNOSIS — R0609 Other forms of dyspnea: Secondary | ICD-10-CM

## 2020-01-03 DIAGNOSIS — R0600 Dyspnea, unspecified: Secondary | ICD-10-CM

## 2020-01-04 ENCOUNTER — Ambulatory Visit: Payer: Medicaid Other

## 2020-01-04 ENCOUNTER — Other Ambulatory Visit: Payer: Self-pay | Admitting: Cardiology

## 2020-01-04 ENCOUNTER — Other Ambulatory Visit: Payer: Self-pay

## 2020-01-04 DIAGNOSIS — Z13220 Encounter for screening for lipoid disorders: Secondary | ICD-10-CM

## 2020-01-05 LAB — LIPID PANEL
Chol/HDL Ratio: 2.7 ratio (ref 0.0–4.4)
Cholesterol, Total: 164 mg/dL (ref 100–199)
HDL: 61 mg/dL (ref >39)
LDL Chol Calc (NIH): 94 mg/dL (ref 0–99)
Triglycerides: 43 mg/dL (ref 0–149)
VLDL Cholesterol Cal: 9 mg/dL (ref 5–40)

## 2020-01-08 NOTE — Progress Notes (Signed)
This was answered in another message.

## 2020-01-08 NOTE — Progress Notes (Signed)
Called and spoke with patient regarding Her echocardiogram, stress and lipids.

## 2020-09-16 ENCOUNTER — Other Ambulatory Visit: Payer: Medicaid Other

## 2020-09-16 DIAGNOSIS — Z20822 Contact with and (suspected) exposure to covid-19: Secondary | ICD-10-CM

## 2020-09-17 LAB — SARS-COV-2, NAA 2 DAY TAT

## 2020-09-17 LAB — NOVEL CORONAVIRUS, NAA: SARS-CoV-2, NAA: DETECTED — AB

## 2020-09-23 ENCOUNTER — Other Ambulatory Visit: Payer: Medicaid Other

## 2020-09-23 DIAGNOSIS — Z20822 Contact with and (suspected) exposure to covid-19: Secondary | ICD-10-CM

## 2020-09-26 LAB — NOVEL CORONAVIRUS, NAA: SARS-CoV-2, NAA: DETECTED — AB

## 2020-09-26 LAB — SARS-COV-2, NAA 2 DAY TAT

## 2020-10-03 ENCOUNTER — Telehealth: Payer: Self-pay | Admitting: *Deleted

## 2020-10-03 NOTE — Telephone Encounter (Signed)
Patient called and stated that "I saw Dr Denman George in the past and then Dr Nelda Marseille followed me. Dr Nelda Marseille is no longer at that practice. I do see Dr Landry Mellow at [place]. Do I need to see Dr Denman George." Per Dr Denman George, Dr Landry Mellow can follow the patient.

## 2020-11-11 ENCOUNTER — Other Ambulatory Visit: Payer: Self-pay

## 2020-11-11 ENCOUNTER — Encounter: Payer: Self-pay | Admitting: Neurology

## 2020-11-11 DIAGNOSIS — R200 Anesthesia of skin: Secondary | ICD-10-CM

## 2020-12-03 ENCOUNTER — Encounter: Payer: Self-pay | Admitting: Neurology

## 2020-12-03 ENCOUNTER — Other Ambulatory Visit: Payer: Self-pay

## 2020-12-03 ENCOUNTER — Ambulatory Visit: Payer: Medicaid Other | Admitting: Neurology

## 2020-12-03 DIAGNOSIS — R200 Anesthesia of skin: Secondary | ICD-10-CM

## 2020-12-03 NOTE — Procedures (Signed)
 [facility]  [address]  [place], [place] [address] Tel: [phone] Fax:  [phone] Test Date:  12/03/2020  Patient: Courtney [name] DOB: 03-Sep-1981 Physician: Narda Amber, DO  Sex: Female Height: 5\' 8"  Ref Phys: Faythe Casa, MD  ID#: [id number]   Technician:    Patient Complaints: This is a 40 year old female referred for evaluation of right hand paresthesias.  NCV & EMG Findings: Extensive electrodiagnostic testing of the right upper extremity shows: 1. Right median, ulnar, and mixed palmar sensory responses are within normal limits. 2. Right median and ulnar motor responses are within normal limits. 3. There is no evidence of active or chronic motor axonal loss changes affecting any of the tested muscles.  Motor unit configuration and recruitment pattern is within normal limits.  Impression: This is a normal study of the right upper extremity.  In particular, there is no evidence of carpal tunnel syndrome, ulnar neuropathy, or cervical radiculopathy.   ___________________________ Narda Amber, DO    Nerve Conduction Studies Anti Sensory Summary Table   Stim Site NR Peak (ms) Norm Peak (ms) P-T Amp (V) Norm P-T Amp  Right Median Anti Sensory (2nd Digit)  2C  Wrist    2.8 <3.4 57.1 >20  Right Ulnar Anti Sensory (5th Digit)  2C  Wrist    2.5 <3.1 56.0 >12   Motor Summary Table   Stim Site NR Onset (ms) Norm Onset (ms) O-P Amp (mV) Norm O-P Amp Site1 Site2 Delta-0 (ms) Dist (cm) Vel (m/s) Norm Vel (m/s)  Right Median Motor (Abd Poll Brev)  2C  Wrist    2.4 <3.9 13.8 >6 Elbow Wrist 5.1 31.0 61 >50  Elbow    7.5  13.5         Right Ulnar Motor (Abd Dig Minimi)  2C  Wrist    1.8 <3.1 10.7 >7 B Elbow Wrist 3.7 24.0 65 >50  B Elbow    5.5  9.6  A Elbow B Elbow 1.5 10.0 67 >50  A Elbow    7.0  9.2          Comparison Summary Table   Stim Site NR Peak (ms) Norm Peak (ms) P-T Amp (V) Site1 Site2 Delta-P (ms) Norm Delta (ms)  Right  Median/Ulnar Palm Comparison (Wrist - 8cm)  2C  Median Palm    1.6 <2.2 69.1 Median Palm Ulnar Palm 0.3   Ulnar Palm    1.3 <2.2 23.3       EMG   Side Muscle Ins Act Fibs Psw Fasc Number Recrt Dur Dur. Amp Amp. Poly Poly. Comment  Right 1stDorInt Nml Nml Nml Nml Nml Nml Nml Nml Nml Nml Nml Nml N/A  Right PronatorTeres Nml Nml Nml Nml Nml Nml Nml Nml Nml Nml Nml Nml N/A  Right Biceps Nml Nml Nml Nml Nml Nml Nml Nml Nml Nml Nml Nml N/A  Right Triceps Nml Nml Nml Nml Nml Nml Nml Nml Nml Nml Nml Nml N/A  Right Deltoid Nml Nml Nml Nml Nml Nml Nml Nml Nml Nml Nml Nml N/A      Waveforms:

## 2021-04-11 ENCOUNTER — Telehealth: Payer: Self-pay | Admitting: General Practice

## 2021-04-11 DIAGNOSIS — O3680X0 Pregnancy with inconclusive fetal viability, not applicable or unspecified: Secondary | ICD-10-CM

## 2021-04-11 NOTE — Telephone Encounter (Signed)
Charlotte from [facility] called and left message on nurse voicemail line stating they saw the patient last night in their office and performed an ultrasound. Patient had LMP 02/02/21 and should have been [redacted]w[redacted]d Ultrasound showed baby measuring 758w1dith no cardiac activity. They are referring patient for follow up ultrasound.   Scheduled appt 8/2 @ 1pm. Called & informed patient. Advised she go to [facility] if she has any problems between now and then such as severe/constant pain or bleeding. Patient verbalized understanding.

## 2021-04-15 ENCOUNTER — Encounter (HOSPITAL_COMMUNITY): Payer: Self-pay | Admitting: Obstetrics and Gynecology

## 2021-04-15 ENCOUNTER — Ambulatory Visit: Payer: Medicaid Other

## 2021-04-15 ENCOUNTER — Inpatient Hospital Stay (HOSPITAL_COMMUNITY)
Admission: AD | Admit: 2021-04-15 | Discharge: 2021-04-15 | Disposition: A | Discharge status: Home / Self Care | Payer: Medicaid Other | Attending: Obstetrics and Gynecology | Admitting: Obstetrics and Gynecology

## 2021-04-15 ENCOUNTER — Ambulatory Visit
Admission: RE | Admit: 2021-04-15 | Discharge: 2021-04-15 | Disposition: A | Discharge status: Home / Self Care | Payer: Medicaid Other | Source: Ambulatory Visit | Attending: Medical | Admitting: Medical

## 2021-04-15 ENCOUNTER — Other Ambulatory Visit: Payer: Self-pay

## 2021-04-15 DIAGNOSIS — Z90721 Acquired absence of ovaries, unilateral: Secondary | ICD-10-CM | POA: Diagnosis not present

## 2021-04-15 DIAGNOSIS — O3680X0 Pregnancy with inconclusive fetal viability, not applicable or unspecified: Secondary | ICD-10-CM | POA: Diagnosis not present

## 2021-04-15 DIAGNOSIS — O09521 Supervision of elderly multigravida, first trimester: Secondary | ICD-10-CM | POA: Insufficient documentation

## 2021-04-15 DIAGNOSIS — O039 Complete or unspecified spontaneous abortion without complication: Secondary | ICD-10-CM | POA: Insufficient documentation

## 2021-04-15 DIAGNOSIS — O209 Hemorrhage in early pregnancy, unspecified: Secondary | ICD-10-CM | POA: Diagnosis present

## 2021-04-15 DIAGNOSIS — Z3A09 9 weeks gestation of pregnancy: Secondary | ICD-10-CM | POA: Insufficient documentation

## 2021-04-15 LAB — CBC
HCT: 35.2 % — ABNORMAL LOW (ref 36.0–46.0)
Hemoglobin: 12.1 g/dL (ref 12.0–15.0)
MCH: 30.3 pg (ref 26.0–34.0)
MCHC: 34.4 g/dL (ref 30.0–36.0)
MCV: 88.2 fL (ref 80.0–100.0)
Platelets: 238 10*3/uL (ref 150–400)
RBC: 3.99 MIL/uL (ref 3.87–5.11)
RDW: 13.9 % (ref 11.5–15.5)
WBC: 8.5 10*3/uL (ref 4.0–10.5)
nRBC: 0 % (ref 0.0–0.2)

## 2021-04-15 LAB — HCG, QUANTITATIVE, PREGNANCY: hCG, Beta Chain, Quant, S: 7618 m[IU]/mL — ABNORMAL HIGH (ref <5)

## 2021-04-15 MED ORDER — PROMETHAZINE HCL 25 MG PO TABS: 25.0000 mg | ORAL_TABLET | Freq: Four times a day (QID) | ORAL | 0 refills | Status: DC | PRN

## 2021-04-15 MED ORDER — HYDROCODONE-ACETAMINOPHEN 5-325 MG PO TABS
1.0000 | ORAL_TABLET | Freq: Once | ORAL | Status: AC
  Administered 2021-04-15: 1 via ORAL
  Filled 2021-04-15: qty 1

## 2021-04-15 MED ORDER — HYDROCODONE-ACETAMINOPHEN 5-325 MG PO TABS: 2.0000 | ORAL_TABLET | ORAL | 0 refills | Status: DC | PRN

## 2021-04-15 MED ORDER — IBUPROFEN 600 MG PO TABS: 600.0000 mg | ORAL_TABLET | Freq: Four times a day (QID) | ORAL | 3 refills | Status: DC | PRN

## 2021-04-15 MED ORDER — HYDROCODONE-ACETAMINOPHEN 5-325 MG PO TABS: 1.0000 | ORAL_TABLET | Freq: Once | ORAL | Status: DC

## 2021-04-15 MED ORDER — MISOPROSTOL 200 MCG PO TABS
800.0000 ug | ORAL_TABLET | Freq: Once | ORAL | Status: AC
  Administered 2021-04-15: 800 ug via ORAL
  Filled 2021-04-15: qty 4

## 2021-04-15 NOTE — MAU Provider Note (Signed)
History     CSN: [id number]  Arrival date and time: 04/15/21 1423   Event Date/Time   First Provider Initiated Contact with Patient 04/15/21 1610      Chief Complaint  Patient presents with   Vaginal Bleeding    HPI Courtney [name] is a 40 y.o. G3P2 at 55w2dwho [facility]. [facility] [place] [facility] [place] 6[place]7 [place] but her baby did not have a heartbeat. [facility] before her results were discussed because her pain could not be tolerated. Pain is suprapubic, pain score 7/10 and does not radiate. She has not taken medication or tried other treatments for this complaint.  OB History     Gravida  3   Para  2   Term      Preterm      AB      Living         SAB      IAB      Ectopic      Multiple      Live Births              Past Medical History:  Diagnosis Date   Anemia    Vaginal delivery 2004, 2008    Past Surgical History:  Procedure Laterality Date   LAPAROSCOPY N/A 05/09/2015   Procedure: LAPAROSCOPY DIAGNOSTIC;  Surgeon: JJanyth Pupa DO;  [facility];  Service: Gynecology;  Laterality: N/A;  [facility]   NO PAST SURGERIES     SALPINGOOPHORECTOMY Left 05/09/2015   Procedure: SALPINGO OOPHORECTOMY;  Surgeon: JJanyth Pupa DO;  [place] [facility];  Service: Gynecology;  Laterality: Left;    Family History  Problem Relation Age of Onset   Hypertension Mother    Hyperlipidemia Mother    Hypertension Father    Heart murmur Sister    Cancer Maternal Grandmother    Cancer Maternal Grandfather    Stroke Paternal Grandmother    Cancer Paternal Grandfather     Social History   Tobacco Use   Smoking status: Never   Smokeless tobacco: Never  Vaping Use   Vaping Use: Never used  Substance Use Topics   Alcohol use: No   Drug use: No    Allergies:   Allergies  Allergen Reactions   Bee Venom Anaphylaxis    No medications prior to admission.    Review of Systems  Gastrointestinal:  Positive for abdominal pain.  Genitourinary:  Positive for vaginal bleeding.  All other systems reviewed and are negative. Physical Exam   Blood pressure 115/80, pulse 84, temperature 98.8 F (37.1 C), temperature source Oral, resp. rate 15, last menstrual period 02/02/2021.  Physical Exam Vitals and nursing note reviewed. Exam conducted with a chaperone present.  Constitutional:      Appearance: Normal appearance.  Cardiovascular:     Rate and Rhythm: Normal rate.     Pulses: Normal pulses.     Heart sounds: Normal heart sounds.  Pulmonary:     Effort: Pulmonary effort is normal.  Abdominal:     General: Abdomen is flat.     Palpations: Abdomen is soft.  Genitourinary:    Comments: Pelvic exam: External genitalia normal, vaginal walls pink and well rugated, cervix visually closed, no lesions noted. Patient spontaneously passed very large dark red clot approximately 2 cm x 3  cm x 6 cm. Scant active bleeding observed on SSE.   Skin:    Capillary Refill: Capillary refill takes less than 2 seconds.  Neurological:     Mental Status: She is alert and oriented to person, place, and time.  Psychiatric:        Mood and Affect: Mood normal.        Behavior: Behavior normal.        Thought Content: Thought content normal.        Judgment: Judgment normal.    MAU Course  Procedures   --Initial imaging demonstrated fetus measuring 6 weeks, absent cardiac activity --Repeat imaging at [facility] this morning did not visualize fetus. Diagnosis: SAB --Discussed expectant management vs Cytotec. Pt verbalizes preference for Cytotec.    Early Intrauterine Pregnancy Failure  X Documented intrauterine pregnancy failure less than or equal to [redacted] weeks gestation  X No serious current illness  X  Baseline Hgb greater than or equal to 10g/dl  X  Patient  has easily accessible transportation to the hospital  X  Clear preference  X  Practitioner/physician deems patient reliable  X  Counseling by practitioner or physician  X Patient education by RN  N/A  Rho-Gam given by RN if indicated  X  Medication dispensed  Cytotec 800 mcg   Ibuprofen 600 mg 1 tablet by mouth every 6 hours as needed #30             Hydrocodone/acetaminophen 5/325 mg by mouth every 4 to 6 hours as needed Phenergan 12.5 mg by mouth every 4 hours as needed for nausea  Patient Vitals for the past 24 hrs:  BP Temp Temp src Pulse Resp  04/15/21 1723 125/86 -- -- 78 15  04/15/21 1448 115/80 98.8 F (37.1 C) Oral 84 15   Results for orders placed or performed during the hospital encounter of 04/15/21 (from the past 24 hour(s))  CBC     Status: Abnormal   Collection Time: 04/15/21  3:16 PM  Result Value Ref Range   WBC 8.5 4.0 - 10.5 K/uL   RBC 3.99 3.87 - 5.11 MIL/uL   Hemoglobin 12.1 12.0 - 15.0 g/dL   HCT 35.2 (L) 36.0 - 46.0 %   MCV 88.2 80.0 - 100.0 fL   MCH 30.3 26.0 - 34.0 pg   MCHC 34.4 30.0 - 36.0 g/dL   RDW 13.9 11.5 - 15.5 %   Platelets 238 150 - 400 K/uL   nRBC 0.0 0.0 - 0.2 %  hCG, quantitative, pregnancy     Status: Abnormal   Collection Time: 04/15/21  3:16 PM  Result Value Ref Range   hCG, Beta Chain, Quant, S 7,618 (H) <5 mIU/mL   US OB LESS THAN 14 WEEKS WITH OB TRANSVAGINAL  Result Date: 04/15/2021 CLINICAL DATA:  Ten weeks 2 days gestation based on last menstrual. Assess viability. No other history provided. EXAM: OBSTETRIC <14 WK [place] AND TRANSVAGINAL OB US TECHNIQUE: Both transabdominal and transvaginal ultrasound examinations were performed for complete evaluation of the gestation as well as the maternal uterus, adnexal regions, and pelvic cul-de-sac. Transvaginal technique was performed to assess early pregnancy. COMPARISON:  None. FINDINGS: Intrauterine gestational sac: None Yolk sac:  N/A Embryo:  N/A Cardiac Activity: N/A Heart Rate: N/A  bpm Subchorionic hemorrhage:  Large. Maternal uterus/adnexae: Thickened heterogeneous material in the endometrial cavity with some areas of fluid. The endocervical canal is widened and also contains mixed debris. Findings most consistent with a on going abortion. The  left ovary is surgically absent. The right ovary is normal. No adnexal mass. No free pelvic fluid collections. IMPRESSION: 1. No intrauterine gestational sac is identified. 2. Thickened endometrium with heterogeneous mixed echogenic and fluid filled endometrial canal. Similar findings in the expanded endocervical canal. 3. Findings consistent with ongoing abortion. 4. Surgically absent left ovary.  Normal right ovary. Electronically Signed   By: Marijo Sanes M.D.   On: 04/15/2021 14:13    Meds ordered this encounter  Medications   HYDROcodone-acetaminophen (NORCO/VICODIN) 5-325 MG per tablet 1 tablet   misoprostol (CYTOTEC) tablet 800 mcg    Confirmed abortion in progress   ibuprofen (ADVIL) 600 MG tablet    Sig: Take 1 tablet (600 mg total) by mouth every 6 (six) hours as needed.    Dispense:  60 tablet    Refill:  3   HYDROcodone-acetaminophen (NORCO/VICODIN) 5-325 MG tablet    Sig: Take 2 tablets by mouth every 4 (four) hours as needed for up to 6 doses.    Dispense:  12 tablet    Refill:  0   promethazine (PHENERGAN) 25 MG tablet    Sig: Take 1 tablet (25 mg total) by mouth every 6 (six) hours as needed for nausea or vomiting.    Dispense:  30 tablet    Refill:  0   Assessment and Plan  --40 y.o. G3P2 s/p SAB --S/p Cytotec for ongoing bleeding --Hgb 12.1 --Blood type O POS --Pain well-controlled with medication given in MAU --Discharge home in stable condition  F/U: --Non-stat Quant hCG at [facility] in one week  Darlina Rumpf, CNM 04/15/2021, 7:55 PM

## 2021-04-15 NOTE — MAU Note (Signed)
.  Courtney [name] is a 40 y.o. at 60w2dhere in MAU reporting: lower abdominal cramping that started after her ultrasound today. She states she also had spotting that started yesterday. Denies any clots or abnormal discharge. X2 weeks ago she did have discharge with an odor but is not there currently.   Pain score: 7 Vitals:   04/15/21 1448  BP: 115/80  Pulse: 84  Resp: 15  Temp: 98.8 F (37.1 C)

## 2021-04-15 NOTE — Progress Notes (Signed)
Call placed to pt to come back inside for [place] results to be reviewed with provider.  No answer by phone and pt not in lobby of [place].  [place] office states pt was in pain.  Reviewed chart and pt at MAU. Pt left [facility] without seeing nurse or provider at office.  MAU notified that pt left office to come to MAU.   Colletta Maryland, RN

## 2021-04-21 ENCOUNTER — Other Ambulatory Visit: Payer: Self-pay

## 2021-04-21 ENCOUNTER — Encounter (HOSPITAL_COMMUNITY): Payer: Self-pay | Admitting: Obstetrics and Gynecology

## 2021-04-21 ENCOUNTER — Inpatient Hospital Stay (HOSPITAL_COMMUNITY): Payer: Medicaid Other

## 2021-04-21 ENCOUNTER — Inpatient Hospital Stay (HOSPITAL_COMMUNITY)
Admission: AD | Admit: 2021-04-21 | Discharge: 2021-04-21 | Disposition: A | Discharge status: Home / Self Care | Payer: Medicaid Other | Attending: Obstetrics and Gynecology | Admitting: Obstetrics and Gynecology

## 2021-04-21 DIAGNOSIS — O039 Complete or unspecified spontaneous abortion without complication: Secondary | ICD-10-CM

## 2021-04-21 DIAGNOSIS — Z90721 Acquired absence of ovaries, unilateral: Secondary | ICD-10-CM | POA: Insufficient documentation

## 2021-04-21 DIAGNOSIS — R1030 Lower abdominal pain, unspecified: Secondary | ICD-10-CM | POA: Diagnosis not present

## 2021-04-21 DIAGNOSIS — R109 Unspecified abdominal pain: Secondary | ICD-10-CM | POA: Diagnosis present

## 2021-04-21 DIAGNOSIS — R1031 Right lower quadrant pain: Secondary | ICD-10-CM

## 2021-04-21 LAB — URINALYSIS, ROUTINE W REFLEX MICROSCOPIC
Bilirubin Urine: NEGATIVE
Glucose, UA: NEGATIVE mg/dL
Hgb urine dipstick: NEGATIVE
Ketones, ur: NEGATIVE mg/dL
Leukocytes,Ua: NEGATIVE
Nitrite: NEGATIVE
Protein, ur: NEGATIVE mg/dL
Specific Gravity, Urine: 1.014 (ref 1.005–1.030)
pH: 6 (ref 5.0–8.0)

## 2021-04-21 LAB — HCG, QUANTITATIVE, PREGNANCY: hCG, Beta Chain, Quant, S: 940 m[IU]/mL — ABNORMAL HIGH (ref <5)

## 2021-04-21 LAB — CBC
HCT: 36.9 % (ref 36.0–46.0)
Hemoglobin: 12 g/dL (ref 12.0–15.0)
MCH: 29.3 pg (ref 26.0–34.0)
MCHC: 32.5 g/dL (ref 30.0–36.0)
MCV: 90.2 fL (ref 80.0–100.0)
Platelets: 276 10*3/uL (ref 150–400)
RBC: 4.09 MIL/uL (ref 3.87–5.11)
RDW: 13.7 % (ref 11.5–15.5)
WBC: 5.5 10*3/uL (ref 4.0–10.5)
nRBC: 0 % (ref 0.0–0.2)

## 2021-04-21 MED ORDER — IBUPROFEN 800 MG PO TABS
800.0000 mg | ORAL_TABLET | Freq: Once | ORAL | Status: AC
  Administered 2021-04-21: 800 mg via ORAL
  Filled 2021-04-21: qty 1

## 2021-04-21 NOTE — MAU Provider Note (Signed)
History     CSN: [id number]  Arrival date and time: 04/21/21 0411   Event Date/Time   First Provider Initiated Contact with Patient 04/21/21 [phone]      Chief Complaint  Patient presents with   Abdominal Pain   HPI Courtney [name] is a 40 y.o. G3P1102 at 24w1dwho presents with vaginal bleeding and abdominal pain.  She was diagnosed with miscarriage last week.  Took Cytotec on Wednesday.  Has continued to have some bleeding but not saturating pads.  States pain initially improved but got worse in the last few days.  Has been taking Vicodin.  Currently rates abdominal cramping 4/10.  Pain is primarily in the right lower quadrant.  Denies fever, dysuria, diarrhea, or constipation.  OB History     Gravida  3   Para  2   Term  1   Preterm  1   AB      Living  2      SAB      IAB      Ectopic      Multiple      Live Births              Past Medical History:  Diagnosis Date   Anemia    Vaginal delivery 2004, 2008    Past Surgical History:  Procedure Laterality Date   LAPAROSCOPY N/A 05/09/2015   Procedure: LAPAROSCOPY DIAGNOSTIC;  Surgeon: JJanyth Pupa DO;  [facility];  Service: Gynecology;  Laterality: N/A;[name] [facility]   SALPINGOOPHORECTOMY Left 05/09/2015   Procedure: SALPINGO OOPHORECTOMY;  Surgeon: JJanyth Pupa DO;  [facility];  Service: Gynecology;  Laterality: Left;    Family History  Problem Relation Age of Onset   Hypertension Mother    Hyperlipidemia Mother    Hypertension Father    Heart murmur Sister    Cancer Maternal Grandmother    Cancer Maternal Grandfather    Stroke Paternal Grandmother    Cancer Paternal Grandfather     Social History   Tobacco Use   Smoking status: Never   Smokeless tobacco: Never  Vaping Use   Vaping Use: Never used  Substance Use Topics   Alcohol use: No   Drug use: No    Allergies:  Allergies  Allergen Reactions   Bee Venom Anaphylaxis    Medications  Prior to Admission  Medication Sig Dispense Refill Last Dose   HYDROcodone-acetaminophen (NORCO/VICODIN) 5-325 MG tablet Take 2 tablets by mouth every 4 (four) hours as needed for up to 6 doses. 12 tablet 0 04/21/2021   ibuprofen (ADVIL) 600 MG tablet Take 1 tablet (600 mg total) by mouth every 6 (six) hours as needed. 60 tablet 3    promethazine (PHENERGAN) 25 MG tablet Take 1 tablet (25 mg total) by mouth every 6 (six) hours as needed for nausea or vomiting. 30 tablet 0     Review of Systems  Constitutional: Negative.   Gastrointestinal:  Positive for abdominal pain.  Genitourinary:  Positive for vaginal bleeding.  Physical Exam   Blood pressure 123/85, pulse 80, resp. rate 17, height '5\' 8"'$  (1.727 m), weight 83.3 kg, last menstrual period 02/02/2021, SpO2 99 %, unknown if currently breastfeeding.  Physical Exam Vitals and nursing note reviewed.  Constitutional:      General: She is not in acute distress.    Appearance: She is well-developed. She is not ill-appearing.  HENT:     Head: Normocephalic and atraumatic.  Eyes:  General: No scleral icterus. Pulmonary:     Effort: Pulmonary effort is normal. No respiratory distress.  Abdominal:     General: Abdomen is flat. There is no distension.     Palpations: Abdomen is soft.     Tenderness: There is abdominal tenderness in the right lower quadrant and suprapubic area. There is no guarding or rebound.  Skin:    General: Skin is warm and dry.  Neurological:     Mental Status: She is alert.  Psychiatric:        Mood and Affect: Mood normal.        Behavior: Behavior normal.    MAU Course  Procedures Results for orders placed or performed during the hospital encounter of 04/21/21 (from the past 24 hour(s))  Urinalysis, Routine w reflex microscopic     Status: Abnormal   Collection Time: 04/21/21  4:39 AM  Result Value Ref Range   Color, Urine YELLOW YELLOW   APPearance HAZY (A) CLEAR   Specific Gravity, Urine 1.014 1.005 -  1.030   pH 6.0 5.0 - 8.0   Glucose, UA NEGATIVE NEGATIVE mg/dL   Hgb urine dipstick NEGATIVE NEGATIVE   Bilirubin Urine NEGATIVE NEGATIVE   Ketones, ur NEGATIVE NEGATIVE mg/dL   Protein, ur NEGATIVE NEGATIVE mg/dL   Nitrite NEGATIVE NEGATIVE   Leukocytes,Ua NEGATIVE NEGATIVE  CBC     Status: None   Collection Time: 04/21/21  5:07 AM  Result Value Ref Range   WBC 5.5 4.0 - 10.5 K/uL   RBC 4.09 3.87 - 5.11 MIL/uL   Hemoglobin 12.0 12.0 - 15.0 g/dL   HCT 36.9 36.0 - 46.0 %   MCV 90.2 80.0 - 100.0 fL   MCH 29.3 26.0 - 34.0 pg   MCHC 32.5 30.0 - 36.0 g/dL   RDW 13.7 11.5 - 15.5 %   Platelets 276 150 - 400 K/uL   nRBC 0.0 0.0 - 0.2 %  hCG, quantitative, pregnancy     Status: Abnormal   Collection Time: 04/21/21  5:07 AM  Result Value Ref Range   hCG, Beta Chain, Quant, S 940 (H) <5 mIU/mL   US OB Transvaginal  Result Date: 04/21/2021 CLINICAL DATA:  40 year old female with right lower quadrant pain and spontaneous abortion suspected on ultrasound 6 days ago. Repeat quantitative beta hCG is pending. EXAM: TRANSVAGINAL OB ULTRASOUND TECHNIQUE: Transvaginal ultrasound was performed for complete evaluation of the gestation as well as the maternal uterus, adnexal regions, and pelvic cul-de-sac. COMPARISON:  04/15/2021. FINDINGS: Intrauterine gestational sac: None. Maternal uterus/adnexae: Endometrium now has a largely bland appearance (image 4) with thickness of about 10 mm. Only trace endometrial fluid suspected (image 9), with no endometrial hypervascularity. No pelvic free fluid. The right ovary appears normal measuring 3.8 x 2.1 x 2.0 cm with multiple small follicles. The left ovary is reported to be surgically absent. IMPRESSION: 1. Minimal endometrial fluid with no convincing retained products of conception at this time. 2. Normal right and absent left ovaries.  No pelvic free fluid. Electronically Signed   By: Genevie Ann M.D.   On: 04/21/2021 06:12    MDM Patient status post miscarriage  presents with increased abdominal pain.  Benign abdominal exam.  No leukocytosis and afebrile. Pain improved with ibuprofen given in MAU.  hCG has dropped significantly and ultrasound shows no signs of retained products.  Multiple small follicles in the right ovary could potentially be causing her discomfort.  Discharge and will need 2-week miscarriage follow-up.  She has previously  seen [place]. Nelda Marseille at [place] and [facility]  Will send message.  Assessment and Plan   1. Miscarriage   2. Right lower quadrant abdominal pain    -msg to [facility] for f/u with Dr Judie Petit 04/21/2021, 4:46 AM

## 2021-04-21 NOTE — MAU Note (Signed)
Pt reports to MAU c/o right sided pain that feels like a pulling or twisting sensation for the past two days.   Pt reports that she took Vicodin about two hours ago and her pain is at a 4.  Pt states that she is having vaginal bleeding that she has change a pad once in a six hour period.  Before she reports changing pad every two hours due to comfort.

## 2021-08-04 ENCOUNTER — Other Ambulatory Visit: Payer: Self-pay | Admitting: Reproductive Endocrinology and Infertility

## 2021-08-04 DIAGNOSIS — N926 Irregular menstruation, unspecified: Secondary | ICD-10-CM

## 2021-08-05 ENCOUNTER — Other Ambulatory Visit (HOSPITAL_COMMUNITY): Payer: Self-pay | Admitting: Reproductive Endocrinology and Infertility

## 2021-08-05 ENCOUNTER — Other Ambulatory Visit: Payer: Self-pay | Admitting: Reproductive Endocrinology and Infertility

## 2021-08-05 DIAGNOSIS — N926 Irregular menstruation, unspecified: Secondary | ICD-10-CM

## 2021-12-05 ENCOUNTER — Other Ambulatory Visit: Payer: Self-pay

## 2021-12-05 ENCOUNTER — Encounter (HOSPITAL_COMMUNITY): Payer: Self-pay

## 2021-12-05 ENCOUNTER — Emergency Department (HOSPITAL_COMMUNITY)
Admission: EM | Admit: 2021-12-05 | Discharge: 2021-12-06 | Disposition: A | Discharge status: Home / Self Care | Payer: Medicaid Other | Attending: Emergency Medicine | Admitting: Emergency Medicine

## 2021-12-05 ENCOUNTER — Emergency Department (HOSPITAL_COMMUNITY): Payer: Medicaid Other

## 2021-12-05 DIAGNOSIS — R059 Cough, unspecified: Secondary | ICD-10-CM | POA: Diagnosis present

## 2021-12-05 DIAGNOSIS — J45909 Unspecified asthma, uncomplicated: Secondary | ICD-10-CM | POA: Insufficient documentation

## 2021-12-05 DIAGNOSIS — J069 Acute upper respiratory infection, unspecified: Secondary | ICD-10-CM | POA: Insufficient documentation

## 2021-12-05 DIAGNOSIS — Z20822 Contact with and (suspected) exposure to covid-19: Secondary | ICD-10-CM | POA: Insufficient documentation

## 2021-12-05 HISTORY — DX: Malignant neoplasm of vagina: C52

## 2021-12-05 MED ORDER — ALBUTEROL SULFATE HFA 108 (90 BASE) MCG/ACT IN AERS
1.0000 | INHALATION_SPRAY | Freq: Four times a day (QID) | RESPIRATORY_TRACT | 0 refills | Status: AC | PRN
Start: 2021-12-05 — End: ?

## 2021-12-05 NOTE — ED Provider Notes (Signed)
?[facility] DEPT ?Provider Note ? ? ?CSN: [id number] ?Arrival date & time: 12/05/21  2201 ? ?  ? ?History ? ?Chief Complaint  ?Patient presents with  ? Cough  ? ? ?Courtney [name] is a 41 y.o. female. ? ?41 year old female with complaint of cough which is occasionally productive with yellow sputum, very mild nasal congestion without postnasal drip.  Symptoms started on Monday.  Reports pain in her chest is worse with deep breath.  Patient is a non-smoker, history of asthma, does not use inhaler regularly.  She is taking Tylenol and vitamins for her symptoms.  Patient is concerned she may have pneumonia since both of her parents currently have pneumonia. ? ? ?  ? ?Home Medications ?Prior to Admission medications   ?Medication Sig Start Date End Date Taking? Authorizing Provider  ?albuterol (VENTOLIN HFA) 108 (90 Base) MCG/ACT inhaler Inhale 1-2 puffs into the lungs every 6 (six) hours as needed for wheezing or shortness of breath. 12/05/21  Yes Tacy Learn, PA-C  ?HYDROcodone-acetaminophen (NORCO/VICODIN) 5-325 MG tablet Take 2 tablets by mouth every 4 (four) hours as needed for up to 6 doses. 04/15/21   Darlina Rumpf, CNM  ?ibuprofen (ADVIL) 600 MG tablet Take 1 tablet (600 mg total) by mouth every 6 (six) hours as needed. 04/15/21   Darlina Rumpf, CNM  ?promethazine (PHENERGAN) 25 MG tablet Take 1 tablet (25 mg total) by mouth every 6 (six) hours as needed for nausea or vomiting. 04/15/21   Darlina Rumpf, CNM  ?   ? ?Allergies    ?Bee venom   ? ?Review of Systems   ?Review of Systems ?Negative except as per HPI ?Physical Exam ?Updated Vital Signs ?BP 131/89 (BP Location: Left Arm)   Pulse 96   Temp 98 ?F (36.7 ?C) (Oral)   Resp 18   Ht '5\' 8"'$  (1.727 m)   Wt 84.8 kg   LMP 11/16/2021   SpO2 98%   BMI 28.43 kg/m?  ?Physical Exam ?Vitals and nursing note reviewed.  ?Constitutional:   ?   General: She is not in acute distress. ?   Appearance: She is well-developed.  She is not diaphoretic.  ?HENT:  ?   Head: Normocephalic and atraumatic.  ?   Right Ear: Tympanic membrane and ear canal normal.  ?   Left Ear: Tympanic membrane and ear canal normal.  ?   Nose: Nose normal.  ?   Mouth/Throat:  ?   Mouth: Mucous membranes are moist.  ?   Pharynx: Posterior oropharyngeal erythema present. No oropharyngeal exudate.  ?Eyes:  ?   Conjunctiva/sclera: Conjunctivae normal.  ?Cardiovascular:  ?   Rate and Rhythm: Normal rate and regular rhythm.  ?   Heart sounds: Normal heart sounds.  ?Pulmonary:  ?   Effort: Pulmonary effort is normal.  ?   Breath sounds: Normal breath sounds.  ?Musculoskeletal:  ?   Cervical back: Neck supple.  ?Lymphadenopathy:  ?   Cervical: No cervical adenopathy.  ?Skin: ?   General: Skin is warm and dry.  ?   Findings: No erythema or rash.  ?Neurological:  ?   Mental Status: She is alert and oriented to person, place, and time.  ?Psychiatric:     ?   Behavior: Behavior normal.  ? ? ?ED Results / Procedures / Treatments   ?Labs ?(all labs ordered are listed, but only abnormal results are displayed) ?Labs Reviewed  ?RESP PANEL BY RT-PCR (FLU A&B, COVID)  ARPGX2  ? ? ?EKG ?None ? ?Radiology ?DG Chest 2 View ? ?Result Date: 12/05/2021 ?CLINICAL DATA:  Productive cough. EXAM: CHEST - 2 VIEW COMPARISON:  January 03, 2020 FINDINGS: The heart size and mediastinal contours are within normal limits. Both lungs are clear. Bilateral radiopaque nipple piercings are seen. The visualized skeletal structures are unremarkable. IMPRESSION: No active cardiopulmonary disease. Electronically Signed   By: Virgina Norfolk M.D.   On: 12/05/2021 23:48   ? ?Procedures ?Procedures  ? ? ?Medications Ordered in ED ?Medications - No data to display ? ?ED Course/ Medical Decision Making/ A&P ?  ?                        ?Medical Decision Making ?Amount and/or Complexity of Data Reviewed ?Radiology: ordered. ? ? ?41 year old female with complaint of cough with very mild nasal congestion, concerned she  may have pneumonia after exposure to her parents who currently both have pneumonia.  On exam, she has very mild posterior pharyngeal erythema, no exudate.  Her lungs are clear.  X-ray is reassuring, no acute disease as read by me, confirmed by radiology.  Patient's COVID and flu test is pending, she would prefer discharge and follow-up with MyChart.  Given prescription for albuterol inhaler, suspect she may have viral URI plus or minus mild bronchitis.  Also recommend Zyrtec and Flonase. ? ? ? ? ? ? ? ?Final Clinical Impression(s) / ED Diagnoses ?Final diagnoses:  ?Acute upper respiratory infection  ? ? ?Rx / DC Orders ?ED Discharge Orders   ? ?      Ordered  ?  albuterol (VENTOLIN HFA) 108 (90 Base) MCG/ACT inhaler  Every 6 hours PRN       ? 12/05/21 2352  ? ?  ?  ? ?  ? ? ?  ?Tacy Learn, PA-C ?12/06/21 0016 ? ?  ?Drenda Freeze, MD ?12/06/21 1659 ? ?

## 2021-12-05 NOTE — ED Triage Notes (Signed)
Patient said she wants a chest x ray to rule out pneumonia. She said she has a cough, clear to yellow and foamy mucus. Symptoms for 2 days, no temperature. Her parents both have pneumonia. It hurts when she takes a deep breath.  ?

## 2021-12-05 NOTE — Discharge Instructions (Signed)
Albuterol inhaler as needed as directed for the burning and cough. ?Can consider using Zyrtec and Flonase. ?

## 2021-12-06 LAB — RESP PANEL BY RT-PCR (FLU A&B, COVID) ARPGX2
Influenza A by PCR: NEGATIVE
Influenza B by PCR: NEGATIVE
SARS Coronavirus 2 by RT PCR: NEGATIVE

## 2022-02-18 ENCOUNTER — Inpatient Hospital Stay (HOSPITAL_COMMUNITY)
Admission: AD | Admit: 2022-02-18 | Discharge: 2022-02-19 | Disposition: A | Discharge status: Home / Self Care | Payer: Medicaid Other | Attending: Obstetrics and Gynecology | Admitting: Obstetrics and Gynecology

## 2022-02-18 ENCOUNTER — Other Ambulatory Visit: Payer: Self-pay

## 2022-02-18 DIAGNOSIS — R1030 Lower abdominal pain, unspecified: Secondary | ICD-10-CM | POA: Insufficient documentation

## 2022-02-18 DIAGNOSIS — R110 Nausea: Secondary | ICD-10-CM | POA: Insufficient documentation

## 2022-02-18 DIAGNOSIS — Z3202 Encounter for pregnancy test, result negative: Secondary | ICD-10-CM

## 2022-02-18 LAB — POCT PREGNANCY, URINE: Preg Test, Ur: NEGATIVE

## 2022-02-18 NOTE — MAU Provider Note (Signed)
Event Date/Time   First Provider Initiated Contact with Patient 02/18/22 2241      S [name] is a 41 y.o. [phone] patient who presents to MAU today with complaint of lower abdominal pain and nausea. Patient with negative UPT in MAU and no home UPT. Patient did not think she was pregnant. Patient is here to be evaluated for ovarian torsion as she states she is taking ovarian stimulation medication and only has one ovary and wants to be evaluated for this condition. Patient's friend present for entire visit.  O BP 126/85 (BP Location: Right Arm)   Pulse 72   Temp 98.8 F (37.1 C) (Oral)   Resp 18   Ht '5\' 8"'$  (1.727 m)   Wt 86.2 kg   SpO2 100%   BMI 28.90 kg/m   Patient Vitals for the past 24 hrs:  BP Temp Temp src Pulse Resp SpO2 Height Weight  02/18/22 2236 126/85 98.8 F (37.1 C) Oral 72 18 100 % '5\' 8"'$  (1.727 m) 86.2 kg   Physical Exam Vitals and nursing note reviewed.  Constitutional:      General: She is not in acute distress.    Appearance: Normal appearance. She is not ill-appearing, toxic-appearing or diaphoretic.  HENT:     Head: Normocephalic and atraumatic.  Pulmonary:     Effort: Pulmonary effort is normal.  Neurological:     Mental Status: She is alert and oriented to person, place, and time.  Psychiatric:        Mood and Affect: Mood normal.        Behavior: Behavior normal.        Thought Content: Thought content normal.        Judgment: Judgment normal.   A Medical screening exam started UPT negative  P Pt transferred to [facility] at her request Report called to ED provider who accepts patient in transfer Warning signs for worsening condition that would warrant emergency follow-up discussed Patient may return to MAU as needed   Felicia Bloomquist, Gerrie Nordmann, NP 02/18/2022 10:45 PM

## 2022-02-18 NOTE — MAU Note (Signed)
.  Courtney [name] is a 41 y.o. at Unknown here in MAU reporting: sudden right sided sharp ABD pain that started yesterday with nausea. Pt took '800mg'$  Motrin yesterday, with no relief. Pt states the pain has improved from yesterday but still constant with mild nausea. Pt denies Vb. Pt reports some abnormal discharge.  POCT preg urine NEG  Onset of complaint: yesterday  Pain score: 4/10 Vitals:   02/18/22 2236  BP: 126/85  Pulse: 72  Resp: 18  Temp: 98.8 F (37.1 C)  SpO2: 100%      Lab orders placed from triage: poct preg urine

## 2022-05-21 ENCOUNTER — Inpatient Hospital Stay (HOSPITAL_COMMUNITY)
Admission: AD | Admit: 2022-05-21 | Discharge: 2022-05-21 | Disposition: A | Discharge status: Home / Self Care | Payer: Medicaid Other | Attending: Obstetrics and Gynecology | Admitting: Obstetrics and Gynecology

## 2022-05-21 ENCOUNTER — Encounter (HOSPITAL_COMMUNITY): Payer: Self-pay

## 2022-05-21 DIAGNOSIS — O26891 Other specified pregnancy related conditions, first trimester: Secondary | ICD-10-CM | POA: Diagnosis present

## 2022-05-21 DIAGNOSIS — O209 Hemorrhage in early pregnancy, unspecified: Secondary | ICD-10-CM | POA: Insufficient documentation

## 2022-05-21 DIAGNOSIS — Z3A11 11 weeks gestation of pregnancy: Secondary | ICD-10-CM | POA: Diagnosis present

## 2022-05-21 DIAGNOSIS — N888 Other specified noninflammatory disorders of cervix uteri: Secondary | ICD-10-CM

## 2022-05-21 LAB — URINALYSIS, ROUTINE W REFLEX MICROSCOPIC
Bilirubin Urine: NEGATIVE
Glucose, UA: NEGATIVE mg/dL
Hgb urine dipstick: NEGATIVE
Ketones, ur: 20 mg/dL — AB
Leukocytes,Ua: NEGATIVE
Nitrite: NEGATIVE
Protein, ur: NEGATIVE mg/dL
Specific Gravity, Urine: 1.019 (ref 1.005–1.030)
pH: 5 (ref 5.0–8.0)

## 2022-05-21 LAB — WET PREP, GENITAL
Clue Cells Wet Prep HPF POC: NONE SEEN
Sperm: NONE SEEN
Trich, Wet Prep: NONE SEEN
WBC, Wet Prep HPF POC: <10 (ref <10)
Yeast Wet Prep HPF POC: NONE SEEN

## 2022-05-21 LAB — GC/CHLAMYDIA PROBE AMP (~~LOC~~) NOT AT ARMC
Chlamydia: NEGATIVE
Comment: NEGATIVE
Comment: NORMAL
Neisseria Gonorrhea: NEGATIVE

## 2022-05-21 NOTE — MAU Provider Note (Signed)
History     CSN: [id number]  Arrival date and time: 05/21/22 0425   Event Date/Time   First Provider Initiated Contact with Patient 05/21/22 [phone]      Chief Complaint  Patient presents with   Abdominal Pain   Vaginal Bleeding   HPI  Courtney [name] is a 41 y.o. [id number] at 63w0dwho presents for evaluation of vaginal bleeding. Patient reports she had some spotting yesterday afternoon that has since resolved. She reports intermittent cramping with the spotting that has also resolved. When the cramping came, she would rate it a 4/10 but it resolved without medication.   She denies any discharge and leaking of fluid. Denies any constipation, diarrhea or any urinary complaints.  She reports a very stressful day yesterday and is concerned that has contributed to her symptoms   OB History     Gravida  4   Para  2   Term  1   Preterm  1   AB      Living  2      SAB      IAB      Ectopic      Multiple      Live Births              Past Medical History:  Diagnosis Date   Anemia    [place]    [place]aginal [place] 2004, [place]    Past Surgical History:  Procedure Laterality Date   LAPAROSCOPY N/A 05/09/2015   Procedure: LAPAROSCOPY DIAGNOSTIC;  Surgeon: JJanyth Pupa DO;  [facility];  Service: Gynecology;  Laterality: N/A;[name]   SALPINGOOPHORECTOMY Left 05/09/2015   Procedure: SALPINGO OOPHORECTOMY;  Surgeon: JJanyth Pupa DO;  [facility];  Service: Gynecology;  Laterality: Left;    Family History  Problem Relation Age of Onset   Hypertension Mother    Hyperlipidemia Mother    Hypertension Father    Heart murmur Sister    Cancer Maternal Grandmother    Cancer Maternal Grandfather    Stroke Paternal Grandmother    Cancer Paternal Grandfather     Social History   Tobacco Use   Smoking status: Never   Smokeless tobacco: Never  Vaping Use   Vaping Use: Never used  Substance Use Topics    Alcohol use: No   Drug use: No    Allergies:  Allergies  Allergen Reactions   Bee Venom Anaphylaxis    Medications Prior to Admission  Medication Sig Dispense Refill Last Dose   Prenatal Vit-Fe Fumarate-FA (MULTIVITAMIN-PRENATAL) 27-0.8 MG TABS tablet Take 1 tablet by mouth daily at 12 noon.   05/20/2022   albuterol (VENTOLIN HFA) 108 (90 Base) MCG/ACT inhaler Inhale 1-2 puffs into the lungs every 6 (six) hours as needed for wheezing or shortness of breath. 18 g 0 More than a month   HYDROcodone-acetaminophen (NORCO/VICODIN) 5-325 MG tablet Take 2 tablets by mouth every 4 (four) hours as needed for up to 6 doses. 12 tablet 0    ibuprofen (ADVIL) 600 MG tablet Take 1 tablet (600 mg total) by mouth every 6 (six) hours as needed. 60 tablet 3    promethazine (PHENERGAN) 25 MG tablet Take 1 tablet (25 mg total) by mouth every 6 (six) hours as needed for nausea or vomiting. 30 tablet 0     Review of Systems  Constitutional: Negative.  Negative for fatigue and fever.  HENT: Negative.    Respiratory: Negative.  Negative for  shortness of breath.   Cardiovascular: Negative.  Negative for chest pain.  Gastrointestinal:  Positive for abdominal pain. Negative for constipation, diarrhea, nausea and vomiting.  Genitourinary:  Positive for vaginal bleeding. Negative for dysuria.  Neurological: Negative.  Negative for dizziness and headaches.   Physical Exam   Blood pressure 124/89, pulse 73, temperature 98.6 F (37 C), temperature source Oral, resp. rate 16, height '5\' 8"'$  (1.727 m), weight 90 kg, last menstrual period 11/16/2021, SpO2 100 %, unknown if currently breastfeeding.  Patient Vitals for the past 24 hrs:  BP Temp Temp src Pulse Resp SpO2 Height Weight  05/21/22 0502 124/89 98.6 F (37 C) Oral 73 16 100 % '5\' 8"'$  (1.727 m) 90 kg    Physical Exam Vitals and nursing note reviewed.  Constitutional:      General: She is not in acute distress.    Appearance: She is well-developed.  HENT:      Head: Normocephalic.  Eyes:     Pupils: Pupils are equal, round, and reactive to light.  Cardiovascular:     Rate and Rhythm: Normal rate and regular rhythm.     Heart sounds: Normal heart sounds.  Pulmonary:     Effort: Pulmonary effort is normal. No respiratory distress.     Breath sounds: Normal breath sounds.  Abdominal:     General: Bowel sounds are normal. There is no distension.     Palpations: Abdomen is soft.     Tenderness: There is no abdominal tenderness.  Genitourinary:    Comments: Pelvic exam: Cervix pink, visually closed, friability noted, scant white creamy discharge, vaginal walls and external genitalia normal   Skin:    General: Skin is warm and dry.  Neurological:     Mental Status: She is alert and oriented to person, place, and time.  Psychiatric:        Mood and Affect: Mood normal.        Behavior: Behavior normal.        Thought Content: Thought content normal.        Judgment: Judgment normal.      MAU Course  Procedures  Results for orders placed or performed during the hospital encounter of 05/21/22 (from the past 24 hour(s))  Urinalysis, Routine w reflex microscopic     Status: Abnormal   Collection Time: 05/21/22  5:28 AM  Result Value Ref Range   Color, Urine YELLOW YELLOW   APPearance HAZY (A) CLEAR   Specific Gravity, Urine 1.019 1.005 - 1.030   pH 5.0 5.0 - 8.0   Glucose, UA NEGATIVE NEGATIVE mg/dL   Hgb urine dipstick NEGATIVE NEGATIVE   Bilirubin Urine NEGATIVE NEGATIVE   Ketones, ur 20 (A) NEGATIVE mg/dL   Protein, ur NEGATIVE NEGATIVE mg/dL   Nitrite NEGATIVE NEGATIVE   Leukocytes,Ua NEGATIVE NEGATIVE  Wet prep, genital     Status: None   Collection Time: 05/21/22  5:28 AM  Result Value Ref Range   Yeast Wet Prep HPF POC NONE SEEN NONE SEEN   Trich, Wet Prep NONE SEEN NONE SEEN   Clue Cells Wet Prep HPF POC NONE SEEN NONE SEEN   WBC, Wet Prep HPF POC <10 <10   Sperm NONE SEEN      MDM Labs ordered and reviewed.    UA Wet prep and gc/chlamydia  Pt informed that the ultrasound is considered a limited OB ultrasound and is not intended to be a complete ultrasound exam.  Patient also informed that the ultrasound is not  being completed with the intent of assessing for fetal or placental anomalies or any pelvic abnormalities.  Explained that the purpose of today's ultrasound is to assess for  viability.  Patient acknowledges the purpose of the exam and the limitations of the study.    Active fetus consistent with dates with FHR 133 bpm  Assessment and Plan   1. Friable cervix   2. [redacted] weeks gestation of pregnancy     -Discharge home in stable condition -Vaginal bleeding precautions discussed -Patient advised to follow-up with OB as scheduled for prenatal care -Patient may return to MAU as needed or if her condition were to change or worsen  Wende Mott, CNM 05/21/2022, 5:20 AM

## 2022-05-21 NOTE — Discharge Instructions (Signed)

## 2022-05-21 NOTE — MAU Note (Signed)
..  Courtney [name] is a 41 y.o. at 29w0dhere in MAU reporting: Earlier today had some abdominal cramping and vaginal bleeding. Reports that she saw pink-tinged discharge when she wiped.  Pain score: 0/10 Vitals:   05/21/22 0502  BP: 124/89  Pulse: 73  Resp: 16  Temp: 98.6 F (37 C)  SpO2: 100%     FHT:145  Lab orders placed from triage:  UA

## 2022-08-03 ENCOUNTER — Other Ambulatory Visit: Payer: Self-pay | Admitting: Obstetrics and Gynecology

## 2022-08-03 ENCOUNTER — Ambulatory Visit (HOSPITAL_BASED_OUTPATIENT_CLINIC_OR_DEPARTMENT_OTHER): Payer: Medicaid Other

## 2022-08-03 ENCOUNTER — Other Ambulatory Visit: Payer: Self-pay | Admitting: *Deleted

## 2022-08-03 ENCOUNTER — Encounter: Payer: Self-pay | Admitting: *Deleted

## 2022-08-03 ENCOUNTER — Ambulatory Visit: Payer: Medicaid Other | Attending: Maternal & Fetal Medicine

## 2022-08-03 ENCOUNTER — Ambulatory Visit: Payer: Medicaid Other | Attending: Obstetrics and Gynecology | Admitting: *Deleted

## 2022-08-03 ENCOUNTER — Ambulatory Visit (HOSPITAL_BASED_OUTPATIENT_CLINIC_OR_DEPARTMENT_OTHER): Payer: Medicaid Other | Admitting: Obstetrics and Gynecology

## 2022-08-03 ENCOUNTER — Ambulatory Visit: Payer: Medicaid Other

## 2022-08-03 VITALS — BP 132/78 | HR 96

## 2022-08-03 DIAGNOSIS — Z3A21 21 weeks gestation of pregnancy: Secondary | ICD-10-CM

## 2022-08-03 DIAGNOSIS — O09522 Supervision of elderly multigravida, second trimester: Secondary | ICD-10-CM

## 2022-08-03 DIAGNOSIS — Z363 Encounter for antenatal screening for malformations: Secondary | ICD-10-CM | POA: Insufficient documentation

## 2022-08-03 DIAGNOSIS — O359XX0 Maternal care for (suspected) fetal abnormality and damage, unspecified, not applicable or unspecified: Secondary | ICD-10-CM | POA: Diagnosis not present

## 2022-08-03 DIAGNOSIS — O35BXX0 Maternal care for other (suspected) fetal abnormality and damage, fetal cardiac anomalies, not applicable or unspecified: Secondary | ICD-10-CM

## 2022-08-03 DIAGNOSIS — O283 Abnormal ultrasonic finding on antenatal screening of mother: Secondary | ICD-10-CM

## 2022-08-03 DIAGNOSIS — Z3689 Encounter for other specified antenatal screening: Secondary | ICD-10-CM

## 2022-08-03 NOTE — Addendum Note (Signed)
Addended by: Staci Righter on: 08/03/2022 01:31 PM   Modules accepted: Orders

## 2022-08-03 NOTE — Progress Notes (Signed)
Maternal-Fetal Medicine   Name: Courtney [name] DOB: [birth year] MRN: [id number]. Referring Provider: Christophe Louis, MD  I had the pleasure of seeing [name] today at the [facility]. She is G4 P1112 at 21w 4d gestation and is here for fetal anatomy scan.  At your office ultrasound fetal bradycardia was noted.  Patient has not had screening for fetal aneuploidies. She reports no chronic medical patient hypertension or diabetes. Obstetrical history is significant for a term vaginal delivery in 2004 of a female infant weighing 5-2 at birth. In 2008, she had a preterm vaginal delivery at 78-weeks' gestation of a female infant weighing 6 lbs. Both her daughters are in good health. She had one early spontaneous miscarriage at 12 weeks. Allergies:NKDA. Advanced maternal age. Patient has not had screening for fetal aneuploidies.  Ultrasound We performed fetal anatomical survey. Fetal biometry is consistent with her previously-established dates. Amniotic fluid is normal and good fetal activity is seen.  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 Both ventricles appear equal. Outflow tracts appear normal. No evidence of heterotaxy. 3-VV appears normal and antegrade flow is seen. No markers of aneuploidies or other fetal structural defects are seen. Nasal bone is seen.  I reassured the patient of normal fetal heart rate and rhythm.   Atrioventricular septal defect (AVSD) I counseled the patient (and later her husband) on the finding of cardiac anomaly. AVSD is present in about one-third of fetuses with Down syndrome. In the absence of Down syndrome, isolated AVSD can be associated with some genetic syndromes. Prenatally-diagnosed AVSD fetuses have a higher likelihood of perinatal mortality. In the surviving newborns, surgical correction is undertaken at 45 to 54 months of age with good prognosis.  I recommended fetal echocardiography and the patient agreed to  have echocardiography.  I discussed and recommended amniocentesis for definitive result on the fetal karyotype. If karyotype is normal, microarray can be performed that detects some but not all genetic conditions. I explained amniocentesis procedure and its possible complication of miscarriage (1 in 500 procedures or less). Fetuses with Down syndrome or other chromosomal anomalies have a higher natural miscarriage rate that is independent of amniocentesis procedure. Patient opted to have amniocentesis.  I recommended genetic counseling after the results are available.  We discussed termination of pregnancy as an option if results confirm Down syndrome. I counseled the couple that recent [place] law prohibits termination of pregnancy for fetal Down syndrome and permits termination only for life-limiting anomalies at this gestational age.   After informed consent, amniocentesis was performed by Dr. Donalee Citrin under ultrasound guidance and 33 milliliters of clear amniotic fluid was withdrawn. Initial 2 milliliters of amniotic fluid was discarded. Fluid was sent to [facility] for FISH, fetal karyotype, and microarray analysis. Patient tolerated the procedure well. Post-procedure fetal heart rate was normal. Patient went over to the lab ([facility]) to have her blood drawn for maternal-cell contamination studies. Blood type: O positive.  We will communicate the results to the patient. -We have requested an appointment for fetal echocardiography ([facility]). -Follow-up fetal growth assessment in 4 weeks.   Thank you for consultation.  If you have any questions or concerns, please contact me the [facility].  Consultation including face-to-face (more than 50%) counseling 30 minutes.

## 2022-08-04 ENCOUNTER — Telehealth: Payer: Self-pay | Admitting: *Deleted

## 2022-08-04 NOTE — Telephone Encounter (Signed)
F/u call post amnio

## 2022-08-05 ENCOUNTER — Telehealth: Payer: Self-pay | Admitting: Obstetrics and Gynecology

## 2022-08-05 NOTE — Telephone Encounter (Signed)
I called the patient and informed her of normal FISH results. No evidence of trisomy 41. Final results in about 10 days. GC to call her after Thanksgiving to discuss microarray.

## 2022-08-11 ENCOUNTER — Telehealth: Payer: Self-pay | Admitting: Obstetrics and Gynecology

## 2022-08-11 NOTE — Telephone Encounter (Signed)
Phone call with patient to review results and limitations of FISH testing from recent amniocentesis (normal) as well as to clarify tests that are pending.  The karyotype is expected in the next week and chromosomal microarray will be reflexively performed if the karyotype is normal.    The patient is currently 62w5dand is considering pregnancy termination depending upon the results of this genetic testing as well as information from her consultation with Dr. [facility]. We talked about the need [place]  Once the karyotype results are available, I will plan to call the patient and determine a plan for a full genetic counseling visit and/or follow up appointments.  She was provided contact information for myself.  DWilburt Finlay MS, CGC

## 2022-08-11 NOTE — Telephone Encounter (Signed)
Left Voicemail for patient to call to review FISH results from amniocentesis and plan for genetic counseling visit once the remainder of the lab results are complete. I may be reached at [phone].  Wilburt Finlay, MS, CGC

## 2022-08-13 ENCOUNTER — Telehealth: Payer: Self-pay | Admitting: Obstetrics and Gynecology

## 2022-08-13 NOTE — Telephone Encounter (Signed)
Phone call to [name] to convey results of the chromosome chromosome analysis from her recent amniocentesis.  I left her contact numbers to reach myself or Analyssa Tallas, our other genetic counselor to discuss these results.  Wilburt Finlay, MS, CGC

## 2022-08-14 ENCOUNTER — Telehealth: Payer: Self-pay | Admitting: Obstetrics and Gynecology

## 2022-08-14 NOTE — Telephone Encounter (Signed)
I spoke with Ms. Foell to provide the results of the chromosome analysis from her recent amniocentesis.  Karyotype is 46,XY, showing no numeric or structural chromosome abnormalities. I explained that this showed no chromosome condition that may be causing the structural cardiac defect in this pregnancy.  Chromosomal microarray is pending and should be available in approximately 2-3 weeks.  The patient expressed that she feels much more positive about the pregnancy at this time and does not wish to pursue termination given the current information.  She would like to wait and talk further once the results of array are available.  I also explained that normal chromosomes and microarray cannot rule out all genetic conditions or causes for heart defects that may be associated with single gene disorders.  She is also aware that waiting on those results will put her at a time in pregnancy where termination is more difficult to arrange if desired.  She has our contact information and will call with any questions.  Wilburt Finlay, MS, CGC

## 2022-08-16 LAB — MCC TRACKING

## 2022-08-19 ENCOUNTER — Ambulatory Visit: Payer: Medicaid Other

## 2022-08-19 ENCOUNTER — Other Ambulatory Visit: Payer: Medicaid Other

## 2022-08-24 LAB — CHROMOSOME ANALYSIS W REFLEX TO SNP, AMNIOTIC
Cells Analyzed: 15
Cells Counted: 15
Cells Karyotyped: 2
Colonies: 15
GTG Band Resolution Achieved: 450

## 2022-08-24 LAB — AFP, AMNIOTIC FLUID
AFP, Amniotic Fluid (mcg/ml): 3.1 ug/mL
Gestational Age(Wks): 21
MOM, Amniotic Fluid: 0.55

## 2022-08-24 LAB — INSIGHT AMNIO FISH XY,13,18,21
Cells Analyzed: 50
Cells Counted: 50

## 2022-08-24 LAB — CHROMOSOME MICROARRAY REFLEX, AMN FLD

## 2022-08-24 LAB — MATERNAL CELL CONTAMINATION

## 2022-08-25 ENCOUNTER — Telehealth: Payer: Self-pay | Admitting: Obstetrics and Gynecology

## 2022-08-25 ENCOUNTER — Other Ambulatory Visit: Payer: Medicaid Other

## 2022-08-25 NOTE — Telephone Encounter (Signed)
I spoke with Ms. 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  We discussed that 8p23.1 deletions involve a partial deletion of the short arm of chromosome 8. The clinical manifestations of 8p23.1 deletions are highly variable. Congenital heart defects (particularly AVSD), congenital diaphragmatic hernia [facility]), and hypospadias/cryptorchidism in males have been frequently reported. Other common features associated with 8p23.1 deletions include low birth weight, postnatal growth deficiency, developmental delays, mild to moderate intellectual disabilities, seizures, microcephaly, dysmorphic facial features and behavioral problems such as hyperactivity and impulsiveness. Some individuals have been reported to have normal intelligence.   The couple was counseled that most cases of 8p23.1 deletions are de novo, occurring for the first time in an affected individual. However, parents of affected children can also carry the deletion. To determine if the condition is de novo or inherited, parental studies are needed.  Ms. Biller and her partner, [name] (dob 08/11/80) are scheduled to come to our clinic on Wednesday, 12/13 for labs for this testing.  Results may take up to 2 weeks and will be communicated to the patient directly.  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  We cannot predict clearly how the condition may manifest in any individual.  Ms. Schiano is aware of the options for continuing this pregnancy or electing termination.  If she continues, we will follow the pregnancy with ultrasound and echocardiography along with pediatric cardiology.  If she elects termination, due to her late gestational age (73w5dtoday), she is aware that travel out of state is required.  We also inquired about her family history.  The  patient reported no family members with intellectual disabilities, structural birth defects, recurrent pregnancy loss or known genetic conditions. She has a healthy 140year old daughter from a prior relationship and she and EJimmie Mollyhave a healthy 141year old daughter. She and her partner have no personal history or cardiac defects, other birth defects or developmental differences.  Per her request[facility] clinical [facility] and information. Ms JParrackwould like them to call her to review scheduling options, procedure details and financial aspects of care in this clinic.    Ms. JMcquittyis encouraged to reach out with any questions and has my contact information. 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 LINKED SDGUY#[id number] .     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 This interval includes numerous OMIM genes [start: [name] to end: CTSB].         Copy number variation within this region appears to be mediated by the flanking olfactory receptor/defensin repeats, resulting in a common  3.7 Mb deletion. The clinical phenotype of patients is variable and may include developmental impairments, mild to moderate intellectual disability, microcephaly, congenital heart disease, diaphragmatic hernia, hypospadias and behavioral disorder. This region includes the [name] gene, haploinsufficiency of which is reported to be causative of heart defects and possibly diaphragmatic hernias.  Affected parental carriers with variable phenotype have also been described (see references). Due to the clinical variability seen in this deletion syndrome, precise prenatal prediction of a phenotype is not  possible.  In order to establish the mode of inheritance, parental analysis is recommended.         No other DNA copy number changes or copy neutral ROH were detected within the present reporting criteria. Genetic counseling is recommended.   Wilburt Finlay, MS, CGC

## 2022-08-26 ENCOUNTER — Ambulatory Visit: Payer: Medicaid Other | Attending: Obstetrics and Gynecology

## 2022-08-26 ENCOUNTER — Telehealth: Payer: Self-pay | Admitting: Genetics

## 2022-08-26 DIAGNOSIS — O359XX0 Maternal care for (suspected) fetal abnormality and damage, unspecified, not applicable or unspecified: Secondary | ICD-10-CM

## 2022-08-26 NOTE — Telephone Encounter (Signed)
Telephone call with Ms. Muhlenkamp and Mr. Danae Orleans to review the microarray results returned by genetic counselor Donette Larry. Reviewed the microarray results and the possible clinical implications in detail with the couple. Spent 30 minutes answering their questions. Please see telephone communication with Donette Larry from 08/25/22 for details of the result.

## 2022-08-27 ENCOUNTER — Ambulatory Visit: Payer: Medicaid Other | Attending: Obstetrics

## 2022-08-27 ENCOUNTER — Encounter: Payer: Self-pay | Admitting: Obstetrics and Gynecology

## 2022-08-27 DIAGNOSIS — Z3A25 25 weeks gestation of pregnancy: Secondary | ICD-10-CM | POA: Diagnosis not present

## 2022-08-27 DIAGNOSIS — Q9389 Other deletions from the autosomes: Secondary | ICD-10-CM | POA: Diagnosis not present

## 2022-08-27 DIAGNOSIS — O3510X0 Maternal care for (suspected) chromosomal abnormality in fetus, unspecified, not applicable or unspecified: Secondary | ICD-10-CM

## 2022-08-27 NOTE — Progress Notes (Signed)
Virtual Visit via Video Note  I connected with Courtney [name] on 08/27/22 at  3:30 PM EST by a video enabled telemedicine application and verified that I am speaking with the correct person using two identifiers.  Location: Patient: home Provider: [facility] at [facility]   I discussed the limitations of evaluation and management by telemedicine and the availability of in person appointments. The patient expressed understanding and agreed to proceed.  Genetic counseling: I met with Courtney [name] and her partner, Courtney [name], via WebEx to again review the results of the chromosomal MicroArray from her amniocentesis and to discuss options for this pregnancy.  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  We discussed that 8p23.1 deletions involve a partial deletion of the short arm of chromosome 8. The clinical manifestations of 8p23.1 deletions are highly variable. Congenital heart defects (particularly AVSD), congenital diaphragmatic hernia [facility]), and hypospadias/cryptorchidism in males have been frequently reported. Other common features associated with 8p23.1 deletions include low birth weight, postnatal growth deficiency, developmental delays, mild to moderate intellectual disabilities, seizures, microcephaly, dysmorphic facial features (which may include high and narrow forehead, broad nasal bridge, epicanthic folds, high arched palate, short neck and low set unusually shaped ears) and behavioral problems such as hyperactivity and impulsiveness. Some individuals have been reported to have normal intelligence.   The couple was counseled that most cases of 8p23.1 deletions are de novo, occurring for the first time in an affected individual. However, parents of affected  children can also carry the deletion. To determine if the condition is de novo or inherited, parental studies are needed.  Courtney [name] and her partner, Courtney [name] (dob 08/11/80) are scheduled to come to our clinic on Wednesday, 12/13 for [name] for this testing.  Results may take up to 2 weeks and will be communicated to the patient directly.   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  We cannot predict clearly how the condition may manifest in any individual.  Courtney [name] is aware of the options for continuing this pregnancy or electing termination.  If she continues, we will follow the pregnancy with ultrasound and echocardiography along with pediatric cardiology.  If she elects termination, due to her late gestational age (17w5dtoday), she is aware that travel out of state is required.   We also inquired about her family history.  The patient reported no family members with intellectual disabilities, structural birth defects, recurrent pregnancy loss or known genetic conditions. She has a healthy 125year old daughter from a prior relationship and she and EJimmie Mollyhave a healthy 137year old daughter. She and her partner have no personal history or cardiac defects, other birth defects or developmental differences.   [facility] request[facility]. Courtney JOkonskiwould like them to call her to review scheduling options, procedure details and financial aspects of care in this clinic.     Courtney. JCartais encouraged to reach out with any questions and has my contact information. The results of the microarray are as follows:     FEMALE WITH 8P23.1 MICRODELETION SYNDROME No maternal cell contamination detected. 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 This interval includes numerous OMIM genes [start: [name] to end: CTSB].         Copy number variation within this region appears to be mediated by the flanking olfactory receptor/defensin repeats, resulting in a common  3.7 Mb deletion. The clinical phenotype of patients is variable and may include developmental impairments, mild to moderate intellectual disability, microcephaly, congenital heart disease, diaphragmatic hernia, hypospadias and behavioral disorder. This region includes the [name] gene, haploinsufficiency of which is reported to be causative of heart defects and possibly diaphragmatic hernias.  Affected parental carriers with variable phenotype have also been described (see references). Due to the clinical variability seen in this deletion syndrome, precise prenatal prediction of a phenotype is not possible.         In order to establish the mode of inheritance, parental analysis is recommended.         No other DNA copy number changes or copy neutral ROH were detected within the present reporting criteria. Genetic counseling is recommended.    Options/Plans for this pregnancy: The patient is currently 25 weeks 0 days gestation.  I was able to speak with the family-planning clinic at [name] again this morning and was informed that given the gestational age and the condition in the pregnancy, termination is not an option in [name].  The patient is aware that if she were to elect termination of pregnancy at this gestational age she would have to travel to [name] for the procedure.  I have been in contact with the [facility] clinic in [name] and they could provide the procedure up until 35 weeks.  The cost of this procedure is likely $9500 and must be paid at the time of the visit.  Some funding may be available if the patient were to choose to pursue assistance  through the [name].  Courtney. [name] would like to be seen again in our clinic for a follow-up ultrasound.  This is scheduled for Friday, December 15 at 7:15 AM.  She would like to speak with Dr. Donalee Citrin to review ultrasound information regarding structural anomalies, growth restriction, or other dysmorphic features that may provide her additional information prior to making her decisions about the pregnancy.  The patient was offered and would like to pursue a second opinion at another institution.  She has the option of being seen by Maternal Fetal Medicine at [facility] on Monday, December 18.  If she would like this appointment, we are happy to confirm her scheduling at [facility] after her ultrasound tomorrow.  Courtney. Lenhard is also already scheduled for a follow-up echocardiogram on Tuesday, December 19 with [facility] in [place].  We explained to Courtney. Haggart that we are here to support her in her decisions throughout this pregnancy and will assist her in scheduling appropriate appointments and follow-ups as needed.   Wilburt Finlay, Courtney, CGC   I provided 40 minutes of non-face-to-face time during this encounter.   Donette Larry

## 2022-08-28 ENCOUNTER — Ambulatory Visit: Payer: Medicaid Other | Admitting: *Deleted

## 2022-08-28 ENCOUNTER — Ambulatory Visit: Payer: Medicaid Other

## 2022-08-28 ENCOUNTER — Ambulatory Visit: Payer: Medicaid Other | Attending: Obstetrics and Gynecology

## 2022-08-28 VITALS — BP 133/83 | HR 84

## 2022-08-28 DIAGNOSIS — Z3A25 25 weeks gestation of pregnancy: Secondary | ICD-10-CM

## 2022-08-28 DIAGNOSIS — O35BXX0 Maternal care for other (suspected) fetal abnormality and damage, fetal cardiac anomalies, not applicable or unspecified: Secondary | ICD-10-CM | POA: Diagnosis present

## 2022-08-28 DIAGNOSIS — O09522 Supervision of elderly multigravida, second trimester: Secondary | ICD-10-CM | POA: Diagnosis not present

## 2022-08-28 DIAGNOSIS — O289 Unspecified abnormal findings on antenatal screening of mother: Secondary | ICD-10-CM

## 2022-08-28 DIAGNOSIS — O359XX0 Maternal care for (suspected) fetal abnormality and damage, unspecified, not applicable or unspecified: Secondary | ICD-10-CM

## 2022-08-31 ENCOUNTER — Encounter: Payer: Self-pay | Admitting: Genetics

## 2022-08-31 ENCOUNTER — Ambulatory Visit: Payer: Medicaid Other

## 2022-08-31 ENCOUNTER — Telehealth: Payer: Self-pay | Admitting: Genetics

## 2022-08-31 NOTE — Telephone Encounter (Signed)
Telephone call to Wendi to give her the phone number for the [facility] clinic in [name]. Left voicemail with [facility] call back number.

## 2022-09-02 ENCOUNTER — Telehealth: Payer: Self-pay | Admitting: Obstetrics and Gynecology

## 2022-09-02 LAB — FOLLOW-UP PRENATAL CMA QPCR: # of Genotyping Targets: 1

## 2022-09-02 LAB — REVEAL (SM) ADJUNCT MICROARRAY

## 2022-09-02 NOTE — Telephone Encounter (Signed)
Results of the parental testing on Ms. Niznik and her partner, [name], are available and showed that neither of them has this deletion.  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    I left a message for the patient indicating these results showed this was not present in either parent and to call me with any questions. The patient has an appointment today at [facility] for a second opinion.  I will let the genetic counselor there know these results as well.  As of our conversation on 12/19, the patient is working toward gathering funds to allow her to have a termination in the coming weeks in [name].  Records have been sent by our clinic to the [facility] clinic and the patient is aware that she must call them directly to schedule an appointment.  Wilburt Finlay, MS, CGC

## 2022-09-03 ENCOUNTER — Telehealth: Payer: Self-pay | Admitting: Obstetrics and Gynecology

## 2022-09-03 NOTE — Telephone Encounter (Signed)
I left a voicemail for Ms. Meunier to follow up on her decisions after her visit to [facility] yesterday for a second opinion and to ensure that the results of the parental testing for the chromosome deletion are clear to her.  I asked her to call back with any questions or concerns.  Wilburt Finlay, MS, CGC

## 2022-10-01 ENCOUNTER — Other Ambulatory Visit: Payer: Self-pay | Admitting: *Deleted

## 2022-10-01 ENCOUNTER — Telehealth: Payer: Self-pay | Admitting: Obstetrics and Gynecology

## 2022-10-01 DIAGNOSIS — O283 Abnormal ultrasonic finding on antenatal screening of mother: Secondary | ICD-10-CM

## 2022-10-01 DIAGNOSIS — O09523 Supervision of elderly multigravida, third trimester: Secondary | ICD-10-CM

## 2022-10-01 NOTE — Telephone Encounter (Signed)
I called [name] to find out her decision about the pregnancy.  She stated that she plans to continue, as the financial burden of traveling to [place] for termination of pregnancy was too great.  We therefore talked about plans for following this pregnancy, as follows:  The patient had a fetal echocardiogram scheduled today with Dr. Renie Ora at [facility] in [place].  Following that visit, Dr. Renie Ora spoke with Dr. Donalee Citrin and requests that we schedule weekly ultrasounds for fetal heart rate and hydrops checks due to the finding of SVTs today on fetal echocardiogram (along with the known structural anomaly).  Delivery will be planned at [facility] due to the cardiac condition in the baby.  I reached out to August Saucer at [facility] to proceed with transfer of care visits and delivery planning.  We will continue to provide local ultrasound follow up as needed in conjunction with [facility].  We also reviewed again the option of adoption for this baby, as there are organizations who connect families who desire to adopt a child with special medical needs/genetic conditions.  [name] stated that her partner was not interested in this option, but we let her know that she is free to ask for additional information at any point if she desires to consider adoption.  [facility] reached out to our clinic regarding a possible research study for mothers who have received a genetic diagnosis of a chromosome microdeletion during pregnancy.  The patient may be interested in participation and we will find out more about this option or have [facility] contact the patient directly regarding blood sample collection.  The following visits are now scheduled for [name] at [facility] in [place]: Thursday, October 08, 2022 at 11:15am - check for heart rate and hydrops Monday, October 12, 2022 at 9:15am - follow up ultrasound a growth  Other weekly visits can be scheduled in our office next week.  We can be reached  at [phone] (Tuesday/Thursday for genetic counselor) or [phone] for the [facility] office regarding ultrasound visits.   Wilburt Finlay, MS, CGC

## 2022-10-07 ENCOUNTER — Telehealth: Payer: Self-pay

## 2022-10-07 NOTE — Telephone Encounter (Signed)
Need to know if patient will be coming in for her Limited Ultrasound with [place] tomorrow. Ask patient to call the office to let someone know.

## 2022-10-08 ENCOUNTER — Ambulatory Visit: Payer: Medicaid Other | Attending: Obstetrics and Gynecology

## 2022-10-08 ENCOUNTER — Ambulatory Visit: Payer: Medicaid Other

## 2022-10-08 ENCOUNTER — Encounter: Payer: Self-pay | Admitting: Genetics

## 2022-10-08 ENCOUNTER — Ambulatory Visit: Payer: Medicaid Other | Admitting: *Deleted

## 2022-10-08 VITALS — BP 125/77 | HR 101

## 2022-10-08 DIAGNOSIS — O3519X0 Maternal care for (suspected) chromosomal abnormality in fetus, other chromosomal abnormality, not applicable or unspecified: Secondary | ICD-10-CM

## 2022-10-08 DIAGNOSIS — O283 Abnormal ultrasonic finding on antenatal screening of mother: Secondary | ICD-10-CM

## 2022-10-08 DIAGNOSIS — O09523 Supervision of elderly multigravida, third trimester: Secondary | ICD-10-CM

## 2022-10-08 DIAGNOSIS — Z3A31 31 weeks gestation of pregnancy: Secondary | ICD-10-CM

## 2022-10-08 DIAGNOSIS — O35BXX0 Maternal care for other (suspected) fetal abnormality and damage, fetal cardiac anomalies, not applicable or unspecified: Secondary | ICD-10-CM

## 2022-10-08 NOTE — Progress Notes (Unsigned)
Today Courtney [name] consented to participating in a research study through [employer]. Signed consent documents were sent to [employer] along with a collection of Courtney [name]'s blood. The study ID through clinicaltrials.gov is [id number]. The study coordinator is Luna Kitchens.

## 2022-10-12 ENCOUNTER — Other Ambulatory Visit: Payer: Self-pay | Admitting: *Deleted

## 2022-10-12 ENCOUNTER — Ambulatory Visit: Payer: Medicaid Other | Admitting: *Deleted

## 2022-10-12 ENCOUNTER — Ambulatory Visit: Payer: Medicaid Other | Attending: Obstetrics and Gynecology

## 2022-10-12 VITALS — BP 137/84 | HR 84

## 2022-10-12 DIAGNOSIS — O09523 Supervision of elderly multigravida, third trimester: Secondary | ICD-10-CM

## 2022-10-12 DIAGNOSIS — O283 Abnormal ultrasonic finding on antenatal screening of mother: Secondary | ICD-10-CM | POA: Diagnosis not present

## 2022-10-12 DIAGNOSIS — O35BXX0 Maternal care for other (suspected) fetal abnormality and damage, fetal cardiac anomalies, not applicable or unspecified: Secondary | ICD-10-CM

## 2022-10-12 DIAGNOSIS — Z3A31 31 weeks gestation of pregnancy: Secondary | ICD-10-CM

## 2022-10-12 DIAGNOSIS — O359XX0 Maternal care for (suspected) fetal abnormality and damage, unspecified, not applicable or unspecified: Secondary | ICD-10-CM | POA: Diagnosis not present

## 2022-10-15 LAB — COMPREHENSIVE METABOLIC PANEL
ALT: 15 IU/L (ref 0–32)
AST: 15 IU/L (ref 0–40)
Albumin/Globulin Ratio: 1.1 — ABNORMAL LOW (ref 1.2–2.2)
Albumin: 3.3 g/dL — ABNORMAL LOW (ref 3.9–4.9)
Alkaline Phosphatase: 101 IU/L (ref 44–121)
BUN/Creatinine Ratio: 5 — ABNORMAL LOW (ref 9–23)
BUN: 3 mg/dL — ABNORMAL LOW (ref 6–24)
Bilirubin Total: 0.5 mg/dL (ref 0.0–1.2)
CO2: 21 mmol/L (ref 20–29)
Calcium: 8.7 mg/dL (ref 8.7–10.2)
Chloride: 102 mmol/L (ref 96–106)
Creatinine, Ser: 0.59 mg/dL (ref 0.57–1.00)
Globulin, Total: 2.9 g/dL (ref 1.5–4.5)
Glucose: 88 mg/dL (ref 70–99)
Potassium: 4 mmol/L (ref 3.5–5.2)
Sodium: 136 mmol/L (ref 134–144)
Total Protein: 6.2 g/dL (ref 6.0–8.5)
eGFR: 116 mL/min/{1.73_m2} (ref >59)

## 2022-10-15 LAB — PRO B NATRIURETIC PEPTIDE: NT-Pro BNP: <36 pg/mL (ref 0–130)

## 2022-10-20 ENCOUNTER — Ambulatory Visit: Payer: 59 | Admitting: *Deleted

## 2022-10-20 ENCOUNTER — Ambulatory Visit: Payer: 59 | Attending: Maternal & Fetal Medicine

## 2022-10-20 ENCOUNTER — Encounter: Payer: Self-pay | Admitting: *Deleted

## 2022-10-20 DIAGNOSIS — O09523 Supervision of elderly multigravida, third trimester: Secondary | ICD-10-CM

## 2022-10-20 DIAGNOSIS — O359XX0 Maternal care for (suspected) fetal abnormality and damage, unspecified, not applicable or unspecified: Secondary | ICD-10-CM | POA: Diagnosis not present

## 2022-10-20 DIAGNOSIS — O35BXX0 Maternal care for other (suspected) fetal abnormality and damage, fetal cardiac anomalies, not applicable or unspecified: Secondary | ICD-10-CM | POA: Diagnosis present

## 2022-10-20 DIAGNOSIS — O281 Abnormal biochemical finding on antenatal screening of mother: Secondary | ICD-10-CM | POA: Diagnosis not present

## 2022-10-20 DIAGNOSIS — O285 Abnormal chromosomal and genetic finding on antenatal screening of mother: Secondary | ICD-10-CM

## 2022-10-20 DIAGNOSIS — Z3A32 32 weeks gestation of pregnancy: Secondary | ICD-10-CM

## 2022-10-26 ENCOUNTER — Ambulatory Visit: Payer: 59 | Attending: Maternal & Fetal Medicine

## 2022-10-26 ENCOUNTER — Ambulatory Visit: Payer: 59 | Admitting: *Deleted

## 2022-10-26 VITALS — BP 138/80 | HR 89

## 2022-10-26 DIAGNOSIS — O285 Abnormal chromosomal and genetic finding on antenatal screening of mother: Secondary | ICD-10-CM

## 2022-10-26 DIAGNOSIS — O35BXX0 Maternal care for other (suspected) fetal abnormality and damage, fetal cardiac anomalies, not applicable or unspecified: Secondary | ICD-10-CM | POA: Insufficient documentation

## 2022-10-26 DIAGNOSIS — O09523 Supervision of elderly multigravida, third trimester: Secondary | ICD-10-CM | POA: Insufficient documentation

## 2022-10-26 DIAGNOSIS — Z3A33 33 weeks gestation of pregnancy: Secondary | ICD-10-CM | POA: Diagnosis not present

## 2022-11-02 ENCOUNTER — Ambulatory Visit: Payer: Medicaid Other

## 2022-11-09 ENCOUNTER — Ambulatory Visit: Payer: Medicaid Other

## 2022-11-10 ENCOUNTER — Ambulatory Visit: Payer: Medicaid Other

## 2022-11-10 ENCOUNTER — Ambulatory Visit: Payer: Medicaid Other | Attending: Maternal & Fetal Medicine

## 2022-11-16 ENCOUNTER — Inpatient Hospital Stay (HOSPITAL_COMMUNITY)
Admission: AD | Admit: 2022-11-16 | Discharge: 2022-11-16 | Disposition: A | Discharge status: Other Acute Inpatient Hospital | Payer: 59 | Attending: Family Medicine | Admitting: Family Medicine

## 2022-11-16 ENCOUNTER — Encounter (HOSPITAL_COMMUNITY): Payer: Self-pay | Admitting: Family Medicine

## 2022-11-16 ENCOUNTER — Inpatient Hospital Stay (HOSPITAL_BASED_OUTPATIENT_CLINIC_OR_DEPARTMENT_OTHER): Payer: 59

## 2022-11-16 DIAGNOSIS — O3519X0 Maternal care for (suspected) chromosomal abnormality in fetus, other chromosomal abnormality, not applicable or unspecified: Secondary | ICD-10-CM

## 2022-11-16 DIAGNOSIS — O35BXX0 Maternal care for other (suspected) fetal abnormality and damage, fetal cardiac anomalies, not applicable or unspecified: Secondary | ICD-10-CM

## 2022-11-16 DIAGNOSIS — O368330 Maternal care for abnormalities of the fetal heart rate or rhythm, third trimester, not applicable or unspecified: Secondary | ICD-10-CM | POA: Diagnosis not present

## 2022-11-16 DIAGNOSIS — I471 Supraventricular tachycardia, unspecified: Secondary | ICD-10-CM | POA: Diagnosis not present

## 2022-11-16 DIAGNOSIS — O359XX0 Maternal care for (suspected) fetal abnormality and damage, unspecified, not applicable or unspecified: Secondary | ICD-10-CM | POA: Diagnosis not present

## 2022-11-16 DIAGNOSIS — O99413 Diseases of the circulatory system complicating pregnancy, third trimester: Secondary | ICD-10-CM | POA: Insufficient documentation

## 2022-11-16 DIAGNOSIS — O99613 Diseases of the digestive system complicating pregnancy, third trimester: Secondary | ICD-10-CM | POA: Diagnosis not present

## 2022-11-16 DIAGNOSIS — Z3A36 36 weeks gestation of pregnancy: Secondary | ICD-10-CM

## 2022-11-16 DIAGNOSIS — O09523 Supervision of elderly multigravida, third trimester: Secondary | ICD-10-CM

## 2022-11-16 DIAGNOSIS — Q9388 Other microdeletions: Secondary | ICD-10-CM | POA: Diagnosis not present

## 2022-11-16 DIAGNOSIS — O368390 Maternal care for abnormalities of the fetal heart rate or rhythm, unspecified trimester, not applicable or unspecified: Secondary | ICD-10-CM

## 2022-11-16 LAB — CBC
HCT: 35.2 % — ABNORMAL LOW (ref 36.0–46.0)
Hemoglobin: 11.8 g/dL — ABNORMAL LOW (ref 12.0–15.0)
MCH: 29.8 pg (ref 26.0–34.0)
MCHC: 33.5 g/dL (ref 30.0–36.0)
MCV: 88.9 fL (ref 80.0–100.0)
Platelets: 222 10*3/uL (ref 150–400)
RBC: 3.96 MIL/uL (ref 3.87–5.11)
RDW: 15.2 % (ref 11.5–15.5)
WBC: 6.1 10*3/uL (ref 4.0–10.5)
nRBC: 0 % (ref 0.0–0.2)

## 2022-11-16 LAB — TYPE AND SCREEN
ABO/RH(D): O POS
Antibody Screen: NEGATIVE

## 2022-11-16 LAB — RPR: RPR Ser Ql: NONREACTIVE

## 2022-11-16 MED ORDER — DIGOXIN 0.25 MG/ML IJ SOLN
0.2500 mg | Freq: Once | INTRAMUSCULAR | Status: AC
  Administered 2022-11-16: 0.25 mg via INTRAVENOUS
  Filled 2022-11-16 (×2): qty 1

## 2022-11-16 MED ORDER — LACTATED RINGERS IV SOLN: INTRAVENOUS | Status: DC

## 2022-11-16 MED ORDER — FLECAINIDE ACETATE 50 MG PO TABS
150.0000 mg | ORAL_TABLET | Freq: Once | ORAL | Status: AC
  Administered 2022-11-16: 150 mg via ORAL
  Filled 2022-11-16: qty 1

## 2022-11-16 NOTE — MAU Note (Signed)
..  Courtney [name] is a 42 y.o. at 40w4dhere in MAU reporting: Sent over from fetal cardiologist for evaluation of fetal heart rate and fetal status. She reports when she was placed on the monitors in the office they informed her that her baby's heart "stopped beating" and then when the HR returned the baby was in SVT. She is supposed to deliver with [facility] and is being followed by MFM with [facility]. 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 Patient takes Digoxin and Flecainide at home. Has not had her dosing today. She reports she takes her dose at 1400 each day.  Stinson, DO made aware and at bedside immediately.  Pain score: Denies pain.  FHT: audible 245 - monitors will not tracing accurate FHR.

## 2022-11-16 NOTE — MAU Provider Note (Signed)
History     CSN: [id number]  Arrival date and time: 11/16/22 1034   Event Date/Time   First Provider Initiated Contact with Patient 11/16/22 1115      No chief complaint on file.  HPI This is a 42yo [id number] with a pregnancy that is complicated by AMA, AV canal defect, 8 P23.1 Microdeletion, fetal tachyarrhythmia with the patient being on flecainide digoxin.  Patient been followed by [facility] and [facility].  She was seen at [facility] pediatric cardiology this morning for a fetal echo.  The patient had expressed decreased fetal movement since yesterday and the baby was noted to be in SVT with heart rates in the 240s.  The patient was sent here for evaluation.  The patient continues to express decreased fetal movement.  OB History     Gravida  4   Para  2   Term  1   Preterm  1   AB  1   Living  2      SAB  1   IAB      Ectopic      Multiple      Live Births  2           Past Medical History:  Diagnosis Date   Anemia    Anxiety    Carpal tunnel syndrome during pregnancy    Chlamydia    Depression    GERD (gastroesophageal reflux disease)    Goiter    HPV (human papilloma virus) infection    Inguinal hernia    Trichomonas infection    Vaginal cancer ([place])    Vaginal delivery 2004, 2008   Vaginal Pap smear, abnormal    Vitamin D deficiency     Past Surgical History:  Procedure Laterality Date   LAPAROSCOPY N/A 05/09/2015   Procedure: LAPAROSCOPY DIAGNOSTIC;  Surgeon: Janyth Pupa, DO;  Location: [facility];  Service: Gynecology;  Laterality: N/AJacqulyn Ducking, [facility] confirmed-[id number]-kah   SALPINGOOPHORECTOMY Left 05/09/2015   Procedure: SALPINGO OOPHORECTOMY;  Surgeon: Janyth Pupa, DO;  Location: [facility];  Service: Gynecology;  Laterality: Left;    Family History  Problem Relation Age of Onset   Hypertension Mother    Hyperlipidemia Mother    Hypertension Father    Heart murmur Sister    Cancer Maternal Grandmother    Cancer  Maternal Grandfather    Stroke Paternal Grandmother    Cancer Paternal Grandfather     Social History   Tobacco Use   Smoking status: Never   Smokeless tobacco: Never  Vaping Use   Vaping Use: Never used  Substance Use Topics   Alcohol use: No   Drug use: No    Allergies:  Allergies  Allergen Reactions   Bee Venom Anaphylaxis    Medications Prior to Admission  Medication Sig Dispense Refill Last Dose   albuterol (VENTOLIN HFA) 108 (90 Base) MCG/ACT inhaler Inhale 1-2 puffs into the lungs every 6 (six) hours as needed for wheezing or shortness of breath. 18 g 0    Prenatal Vit-Fe Fumarate-FA (MULTIVITAMIN-PRENATAL) 27-0.8 MG TABS tablet Take 1 tablet by mouth daily at 12 noon.       Review of Systems Physical Exam   Blood pressure (!) 147/95, pulse 87, temperature 98.9 F (37.2 C), temperature source Oral, resp. rate 17, height '5\' 8"'$  (1.727 m), weight 103.2 kg, last menstrual period 03/05/2022, SpO2 100 %, unknown if currently breastfeeding.  Physical Exam Vitals reviewed.  Constitutional:  Appearance: Normal appearance.  Pulmonary:     Effort: Pulmonary effort is normal.     Breath sounds: Normal breath sounds.  Abdominal:     General: Abdomen is flat.  Neurological:     Mental Status: She is alert.  Psychiatric:        Mood and Affect: Mood normal.        Behavior: Behavior normal.        Thought Content: Thought content normal.        Judgment: Judgment normal.     MAU Course  Procedures NST:  Baseline: 240s (FHR on monitor shows 120-130s)  Variability: mild Accelerations: none  Decelerations: none Contractions: none   [place] I discussed the patient with Dr. Annamaria Boots with MFM who recommended BPP. Due to patient's condition, NICU notified - talked with Dr Katherina Mires, who recommended that patient be transferred if reassuring BPP. He was ok with emergent delivery here with the understanding that the baby would be stabilized and transferred.   BPP 8/8 with  persistent tachyarrhythmia.  Discussed pt with Dr Bartholomew Boards of [facility] - recommended dose of flecainide '150mg'$  and digoxin 250 mcg after EKG. Recommended transfer to [facility] for continued observation if baby converted to NSR or possible delivery if persistent SVT.   I discussed patient with Dr Sherral Hammers of MFM at [facility]. I discussed course, imaging, NST, doses of medication. She accepted patient. Will keep patient NPO.   EKG read: NSR with HR 77. Normal intervals. Normal QT.    Assessment and Plan   1. Fetal arrhythmia affecting pregnancy, antepartum   2. [redacted] weeks gestation of pregnancy   3. Fetal atrioventricular canal malformation during pregnancy, antepartum, single or unspecified fetus   4. Multigravida of advanced maternal age in third trimester    Transfer to [facility] for evaluation. Keep NPO. LR fluid running.  Truett Mainland 11/16/2022, 11:24 AM

## 2022-11-16 NOTE — MAU Note (Signed)
--  Report given to Mearl Latin, Agricultural consultant, at [phone]. Patient to go to [phone].  --Report given to Legrand Como with [facility] at 1240. --[facility] arrived at 1250 to transport patient.

## 2024-02-02 IMAGING — CR DG CHEST 2V
2 series · 2 of 2 positions shown · non-contrast
Comparison: January 03, 2020

CLINICAL DATA: Productive cough.

EXAM:
CHEST - 2 VIEW

[w chest pa]
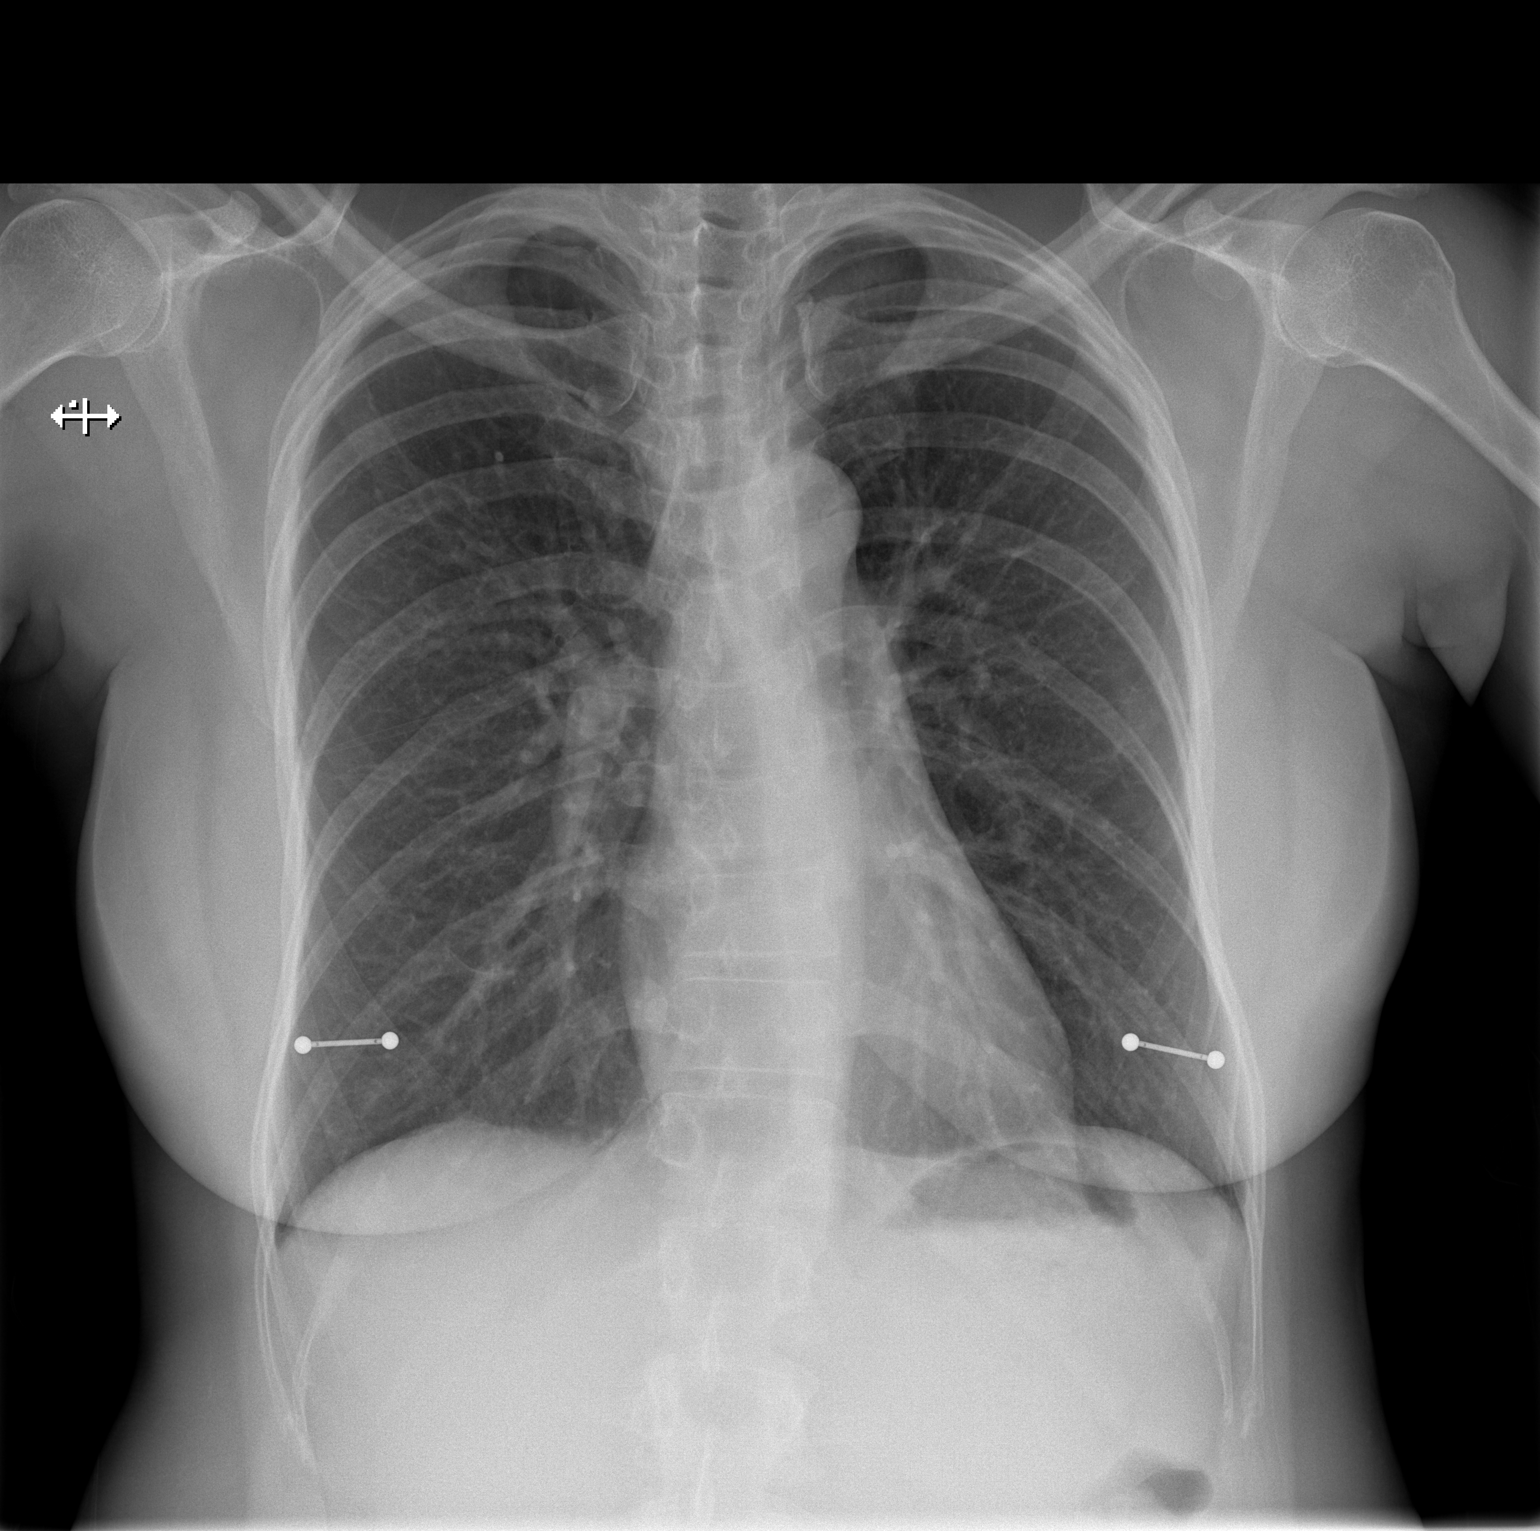

[w chest lat]
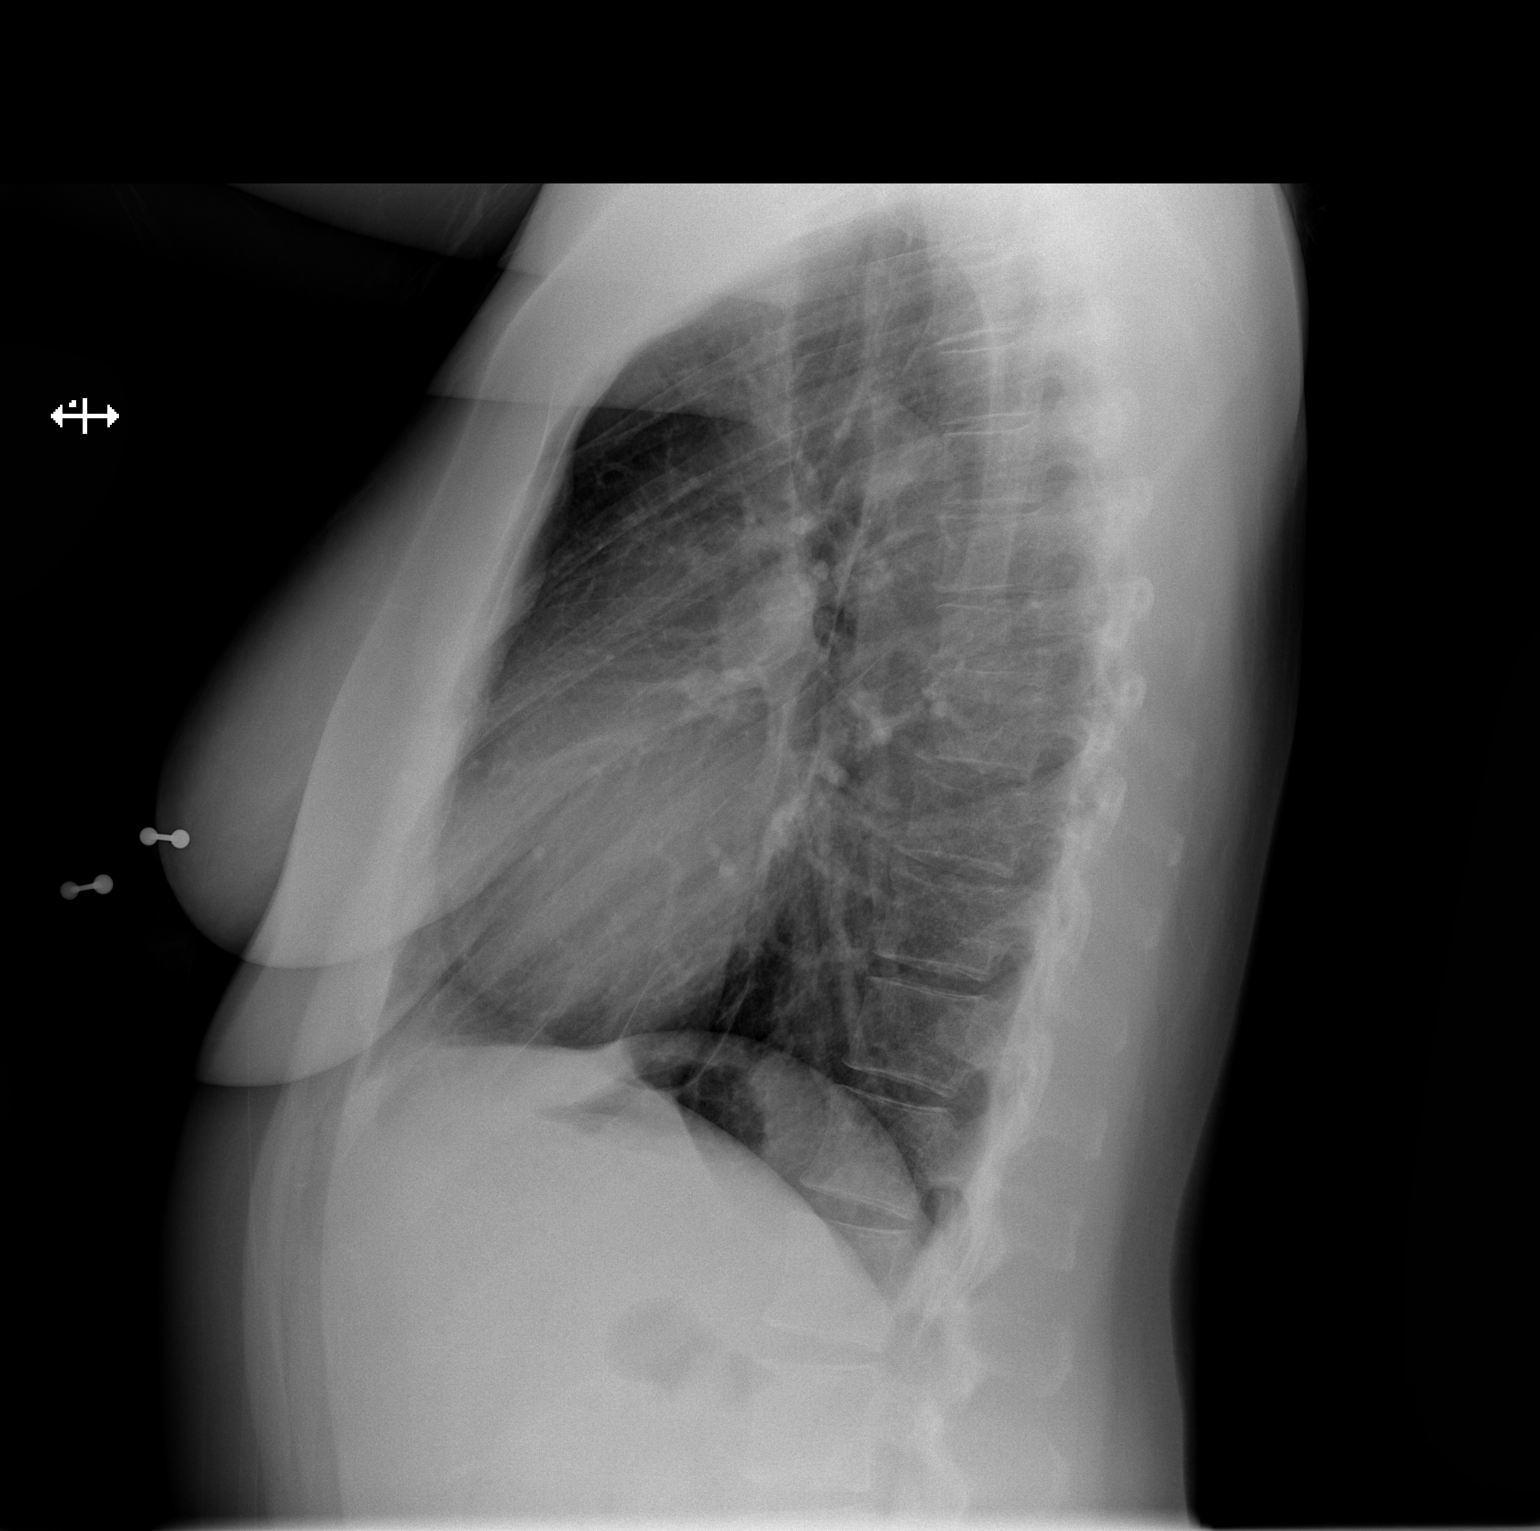

[2 of 2 positions shown; findings below may reference images not displayed]

FINDINGS: The heart size and mediastinal contours are within normal limits.
Both lungs are clear. Bilateral radiopaque nipple piercings are
seen. The visualized skeletal structures are unremarkable.
IMPRESSION: No active cardiopulmonary disease.

## 2024-03-08 ENCOUNTER — Other Ambulatory Visit: Payer: Self-pay | Admitting: Internal Medicine

## 2024-03-08 DIAGNOSIS — Z1231 Encounter for screening mammogram for malignant neoplasm of breast: Secondary | ICD-10-CM

## 2024-03-15 ENCOUNTER — Ambulatory Visit
Admission: RE | Admit: 2024-03-15 | Discharge: 2024-03-15 | Disposition: A | Discharge status: Home / Self Care | Source: Ambulatory Visit | Attending: Internal Medicine | Admitting: Internal Medicine

## 2024-03-15 DIAGNOSIS — Z1231 Encounter for screening mammogram for malignant neoplasm of breast: Secondary | ICD-10-CM

## 2024-03-24 ENCOUNTER — Other Ambulatory Visit: Payer: Self-pay | Admitting: Internal Medicine

## 2024-03-24 DIAGNOSIS — N63 Unspecified lump in unspecified breast: Secondary | ICD-10-CM

## 2024-03-30 ENCOUNTER — Ambulatory Visit
Admission: RE | Admit: 2024-03-30 | Discharge: 2024-03-30 | Disposition: A | Discharge status: Home / Self Care | Source: Ambulatory Visit | Attending: Internal Medicine | Admitting: Internal Medicine

## 2024-03-30 DIAGNOSIS — N63 Unspecified lump in unspecified breast: Secondary | ICD-10-CM
